# Patient Record
Sex: Female | Born: 1945
Health system: Southern US, Community
[De-identification: ages and names within clinical notes are randomized; demographics above are authoritative.]

## PROBLEM LIST (undated history)

## (undated) ENCOUNTER — Emergency Department (HOSPITAL_COMMUNITY): Admission: EM | Payer: Medicare HMO | Source: Home / Self Care

## (undated) DIAGNOSIS — N946 Dysmenorrhea, unspecified: Secondary | ICD-10-CM

## (undated) DIAGNOSIS — I2510 Atherosclerotic heart disease of native coronary artery without angina pectoris: Secondary | ICD-10-CM

## (undated) DIAGNOSIS — E2839 Other primary ovarian failure: Secondary | ICD-10-CM

## (undated) DIAGNOSIS — Z720 Tobacco use: Secondary | ICD-10-CM

## (undated) DIAGNOSIS — N809 Endometriosis, unspecified: Secondary | ICD-10-CM

## (undated) DIAGNOSIS — I10 Essential (primary) hypertension: Secondary | ICD-10-CM

## (undated) DIAGNOSIS — I639 Cerebral infarction, unspecified: Secondary | ICD-10-CM

## (undated) DIAGNOSIS — I219 Acute myocardial infarction, unspecified: Secondary | ICD-10-CM

## (undated) DIAGNOSIS — R4189 Other symptoms and signs involving cognitive functions and awareness: Secondary | ICD-10-CM

## (undated) DIAGNOSIS — N63 Unspecified lump in unspecified breast: Secondary | ICD-10-CM

## (undated) HISTORY — DX: Cerebral infarction, unspecified: I63.9

## (undated) HISTORY — PX: BACK SURGERY: SHX140

## (undated) HISTORY — DX: Tobacco use: Z72.0

## (undated) HISTORY — DX: Other primary ovarian failure: E28.39

## (undated) HISTORY — DX: Acute myocardial infarction, unspecified: I21.9

## (undated) HISTORY — DX: Atherosclerotic heart disease of native coronary artery without angina pectoris: I25.10

## (undated) HISTORY — DX: Other symptoms and signs involving cognitive functions and awareness: R41.89

## (undated) HISTORY — DX: Endometriosis, unspecified: N80.9

## (undated) HISTORY — DX: Unspecified lump in unspecified breast: N63.0

## (undated) HISTORY — PX: ABDOMINAL HYSTERECTOMY: SHX81

## (undated) HISTORY — DX: Dysmenorrhea, unspecified: N94.6

---

## 1997-11-21 ENCOUNTER — Other Ambulatory Visit: Admission: RE | Admit: 1997-11-21 | Discharge: 1997-11-21 | Payer: Self-pay | Admitting: Obstetrics and Gynecology

## 1998-01-05 ENCOUNTER — Emergency Department (HOSPITAL_COMMUNITY): Admission: EM | Admit: 1998-01-05 | Discharge: 1998-01-05 | Payer: Self-pay | Admitting: Emergency Medicine

## 1998-01-05 ENCOUNTER — Encounter: Payer: Self-pay | Admitting: Emergency Medicine

## 1998-12-08 ENCOUNTER — Other Ambulatory Visit: Admission: RE | Admit: 1998-12-08 | Discharge: 1998-12-08 | Payer: Self-pay | Admitting: Obstetrics & Gynecology

## 2000-01-26 ENCOUNTER — Other Ambulatory Visit: Admission: RE | Admit: 2000-01-26 | Discharge: 2000-01-26 | Payer: Self-pay | Admitting: Obstetrics and Gynecology

## 2001-04-19 ENCOUNTER — Other Ambulatory Visit: Admission: RE | Admit: 2001-04-19 | Discharge: 2001-04-19 | Payer: Self-pay | Admitting: Obstetrics and Gynecology

## 2002-05-20 ENCOUNTER — Encounter: Payer: Self-pay | Admitting: Neurosurgery

## 2002-05-20 ENCOUNTER — Inpatient Hospital Stay (HOSPITAL_COMMUNITY): Admission: RE | Admit: 2002-05-20 | Discharge: 2002-05-21 | Payer: Self-pay | Admitting: Neurosurgery

## 2002-06-19 ENCOUNTER — Other Ambulatory Visit: Admission: RE | Admit: 2002-06-19 | Discharge: 2002-06-19 | Payer: Self-pay | Admitting: Obstetrics and Gynecology

## 2002-07-02 ENCOUNTER — Encounter: Payer: Self-pay | Admitting: *Deleted

## 2002-07-02 ENCOUNTER — Encounter: Admission: RE | Admit: 2002-07-02 | Discharge: 2002-07-02 | Payer: Self-pay | Admitting: *Deleted

## 2005-08-05 ENCOUNTER — Inpatient Hospital Stay (HOSPITAL_COMMUNITY): Admission: AD | Admit: 2005-08-05 | Discharge: 2005-08-06 | Payer: Self-pay | Admitting: Neurosurgery

## 2006-09-05 ENCOUNTER — Encounter: Admission: RE | Admit: 2006-09-05 | Discharge: 2006-09-05 | Payer: Self-pay | Admitting: Orthopedic Surgery

## 2006-10-03 ENCOUNTER — Ambulatory Visit (HOSPITAL_COMMUNITY): Admission: RE | Admit: 2006-10-03 | Discharge: 2006-10-03 | Payer: Self-pay | Admitting: Neurosurgery

## 2006-12-28 ENCOUNTER — Encounter: Admission: RE | Admit: 2006-12-28 | Discharge: 2006-12-28 | Payer: Self-pay | Admitting: Neurosurgery

## 2007-02-05 ENCOUNTER — Inpatient Hospital Stay (HOSPITAL_COMMUNITY): Admission: RE | Admit: 2007-02-05 | Discharge: 2007-02-06 | Payer: Self-pay | Admitting: Neurosurgery

## 2007-02-11 ENCOUNTER — Emergency Department (HOSPITAL_COMMUNITY): Admission: EM | Admit: 2007-02-11 | Discharge: 2007-02-11 | Payer: Self-pay | Admitting: Emergency Medicine

## 2007-02-14 ENCOUNTER — Emergency Department (HOSPITAL_COMMUNITY): Admission: EM | Admit: 2007-02-14 | Discharge: 2007-02-14 | Payer: Self-pay | Admitting: Emergency Medicine

## 2007-02-23 ENCOUNTER — Emergency Department (HOSPITAL_COMMUNITY): Admission: EM | Admit: 2007-02-23 | Discharge: 2007-02-23 | Payer: Self-pay | Admitting: Emergency Medicine

## 2007-02-24 ENCOUNTER — Emergency Department (HOSPITAL_COMMUNITY): Admission: EM | Admit: 2007-02-24 | Discharge: 2007-02-24 | Payer: Self-pay | Admitting: Emergency Medicine

## 2007-02-27 ENCOUNTER — Emergency Department (HOSPITAL_COMMUNITY): Admission: EM | Admit: 2007-02-27 | Discharge: 2007-02-27 | Payer: Self-pay | Admitting: Emergency Medicine

## 2007-03-06 ENCOUNTER — Inpatient Hospital Stay (HOSPITAL_COMMUNITY): Admission: EM | Admit: 2007-03-06 | Discharge: 2007-03-19 | Payer: Self-pay | Admitting: Emergency Medicine

## 2007-03-06 ENCOUNTER — Encounter (INDEPENDENT_AMBULATORY_CARE_PROVIDER_SITE_OTHER): Payer: Self-pay | Admitting: Internal Medicine

## 2007-06-28 ENCOUNTER — Encounter: Admission: RE | Admit: 2007-06-28 | Discharge: 2007-06-28 | Payer: Self-pay | Admitting: Neurosurgery

## 2007-07-27 ENCOUNTER — Emergency Department (HOSPITAL_COMMUNITY): Admission: EM | Admit: 2007-07-27 | Discharge: 2007-07-27 | Payer: Self-pay | Admitting: Emergency Medicine

## 2007-09-04 ENCOUNTER — Inpatient Hospital Stay (HOSPITAL_COMMUNITY): Admission: RE | Admit: 2007-09-04 | Discharge: 2007-09-07 | Payer: Self-pay | Admitting: Neurosurgery

## 2007-09-21 ENCOUNTER — Emergency Department (HOSPITAL_COMMUNITY): Admission: EM | Admit: 2007-09-21 | Discharge: 2007-09-21 | Payer: Self-pay | Admitting: Emergency Medicine

## 2007-09-28 ENCOUNTER — Emergency Department (HOSPITAL_COMMUNITY): Admission: EM | Admit: 2007-09-28 | Discharge: 2007-09-28 | Payer: Self-pay | Admitting: Emergency Medicine

## 2007-10-02 ENCOUNTER — Encounter: Admission: RE | Admit: 2007-10-02 | Discharge: 2007-10-02 | Payer: Self-pay | Admitting: Neurosurgery

## 2008-05-26 ENCOUNTER — Emergency Department (HOSPITAL_COMMUNITY): Admission: EM | Admit: 2008-05-26 | Discharge: 2008-05-26 | Payer: Self-pay | Admitting: Emergency Medicine

## 2009-03-30 IMAGING — CR DG HIP (WITH OR WITHOUT PELVIS) 2-3V*L*
3 series · 3 of 3 positions shown · non-contrast
Comparison: None.
COMPARISON: None.

CLINICAL DATA: Fell ? left hip pain.
 LEFT HIP ? 3 VIEW:

[t pelvis a.p. *]
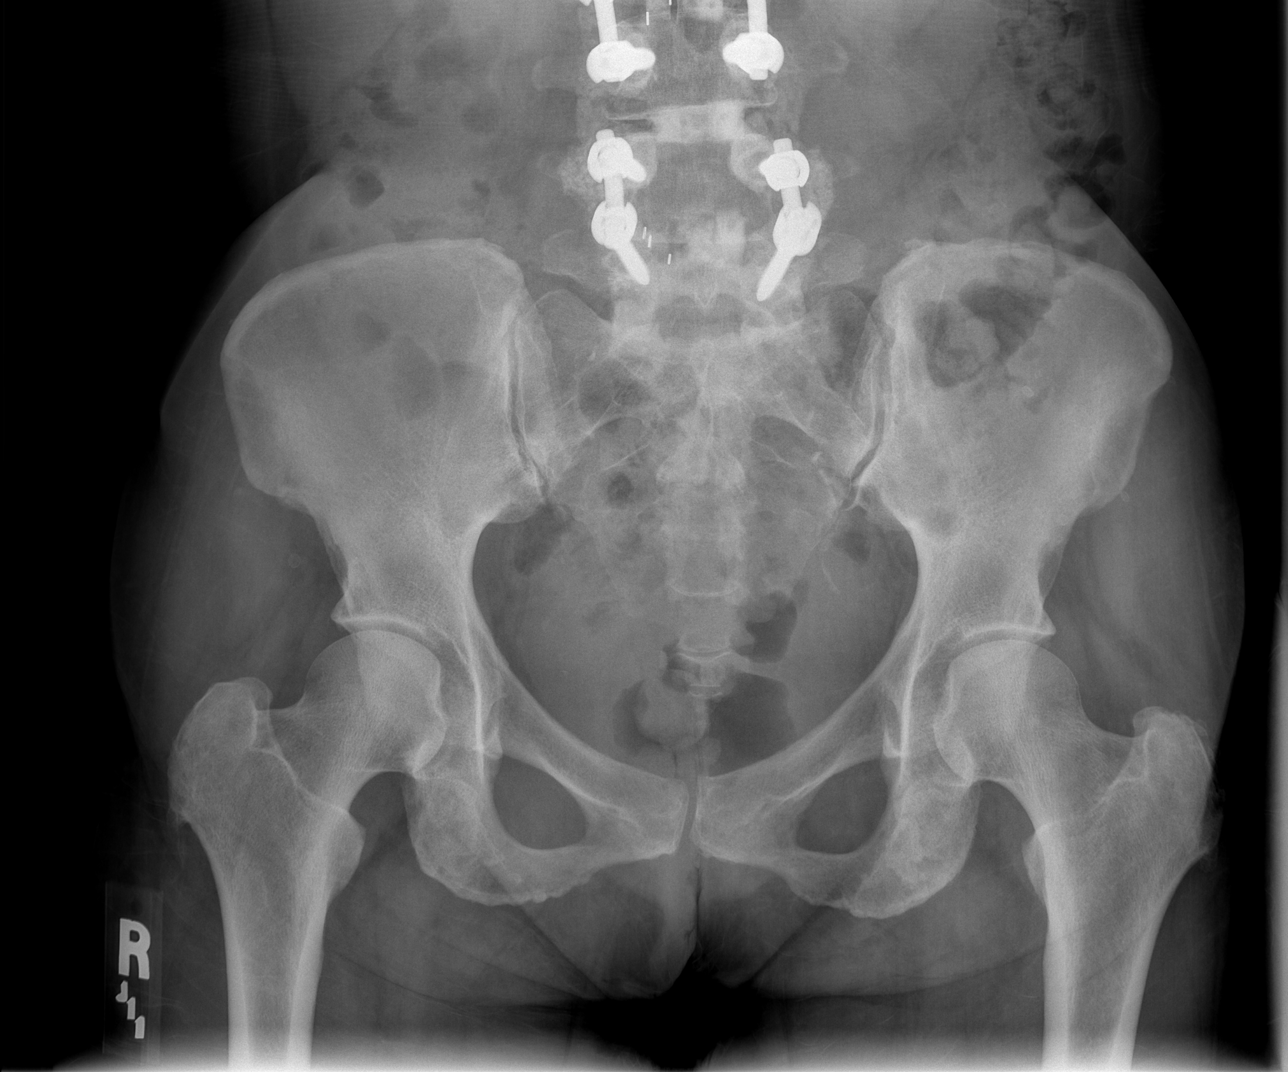

[t hip ap left]
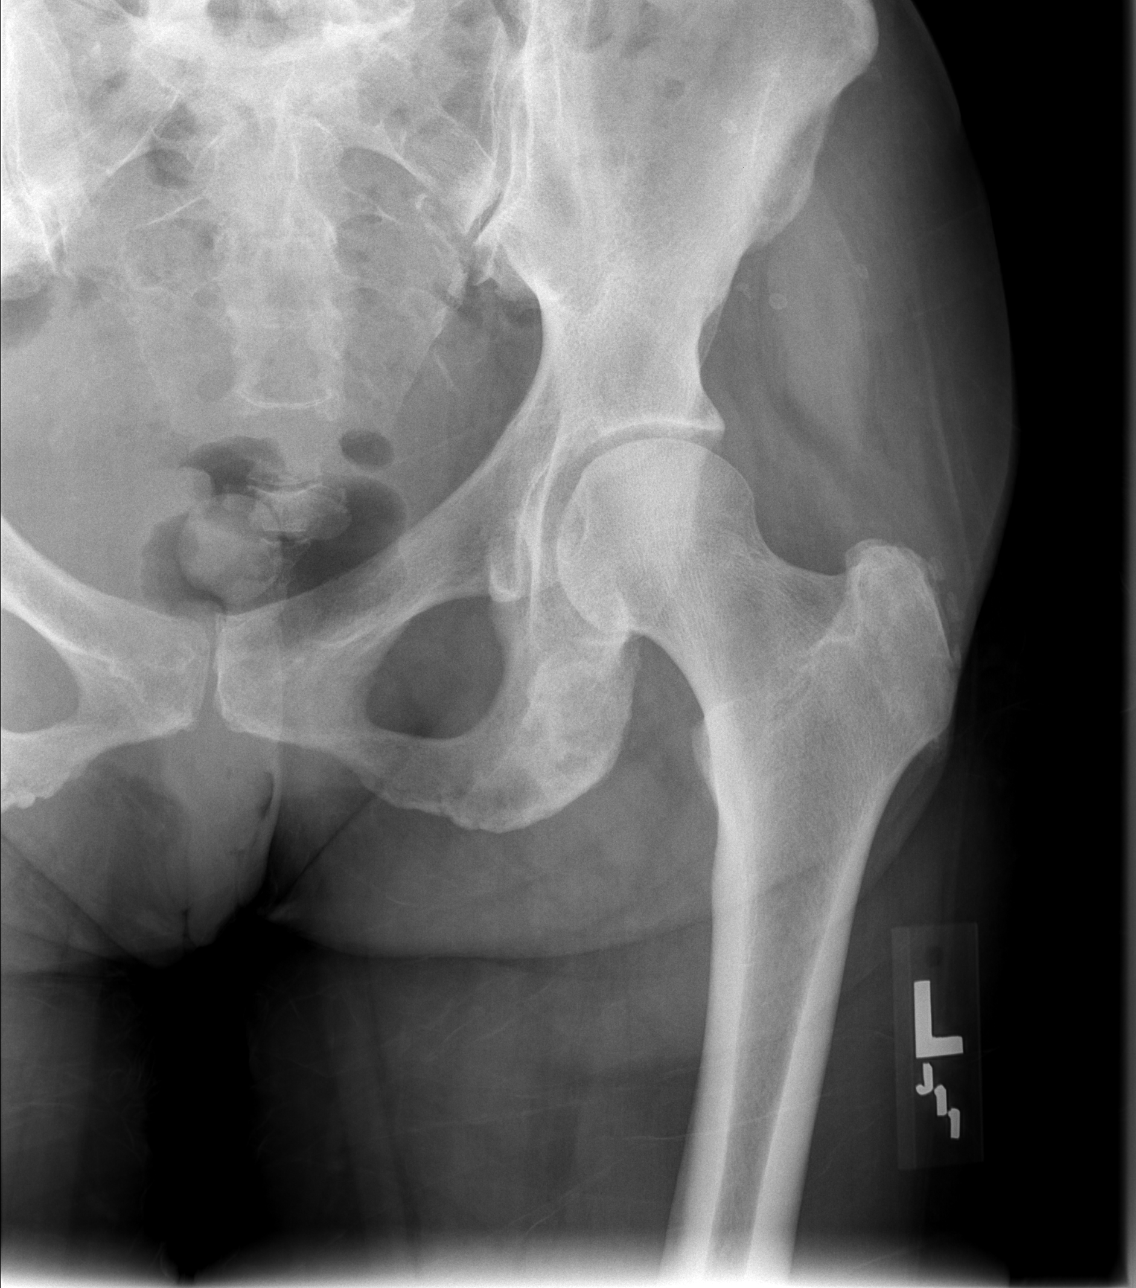

[t hip frog leg left]
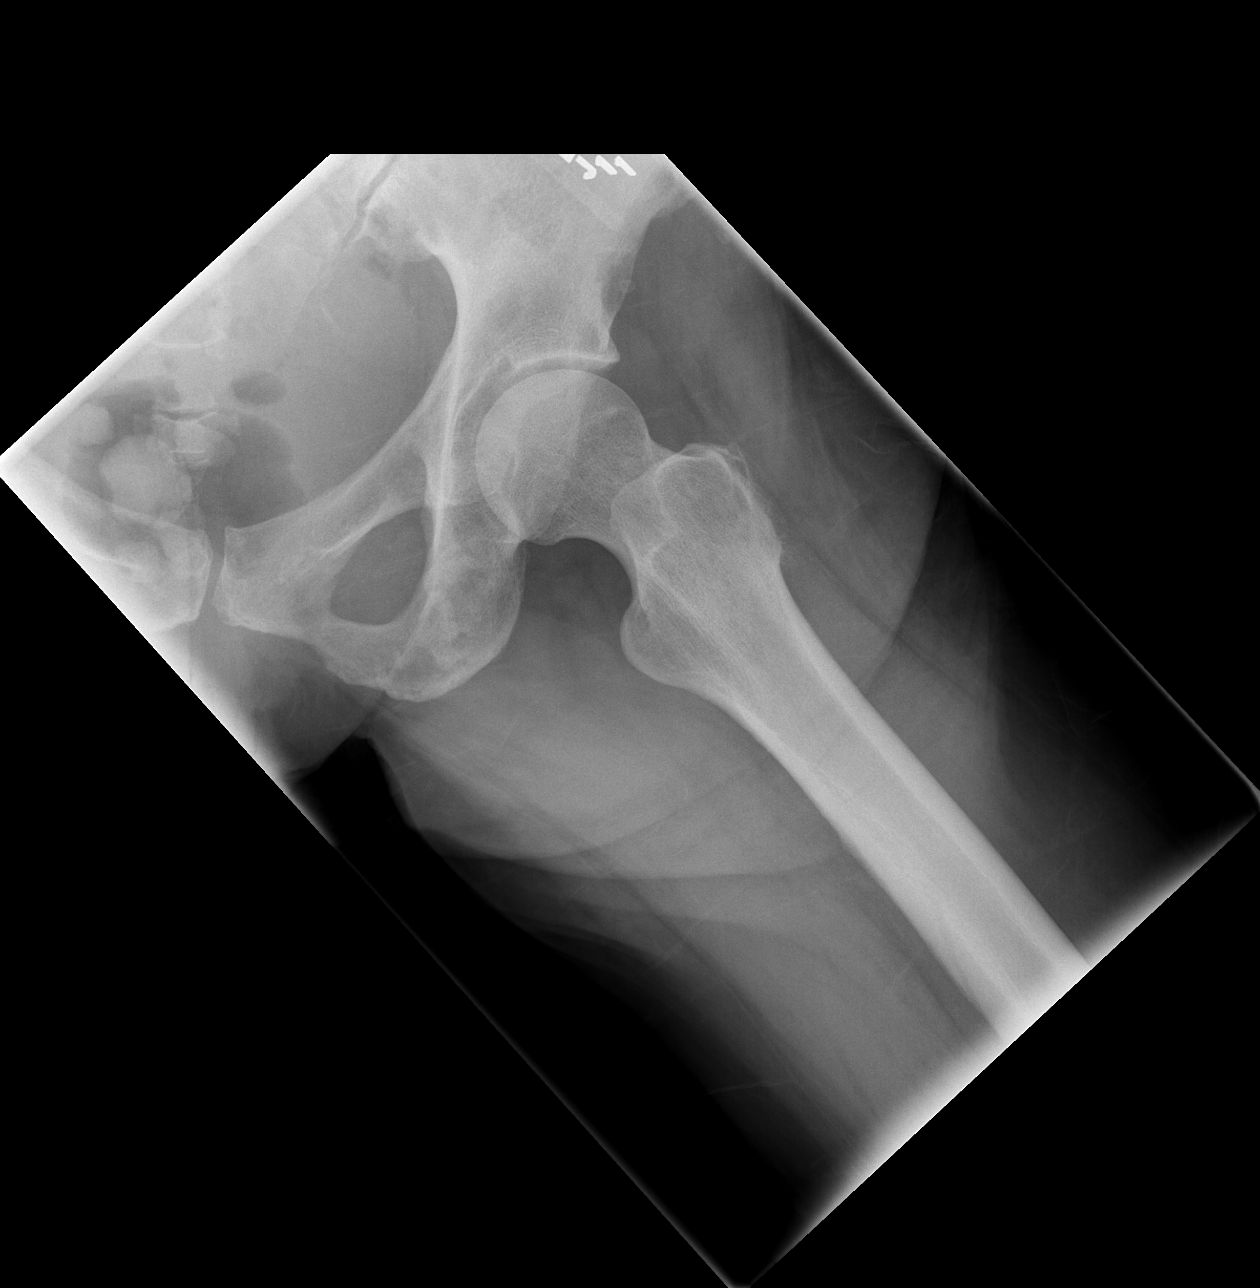

[3 of 3 positions shown; findings below may reference images not displayed]

FINDINGS: No fracture noted.  No significant arthritis. Prior lumbar fusion. Bony pelvis intact.
IMPRESSION: No acute findings.
 LEFT FEMUR ? 2 VIEW:
FINDINGS: No fracture or dislocation.  No significant degenerative changes of the knee or hip.
IMPRESSION: No acute findings.

## 2009-04-06 IMAGING — CT CT HEAD W/O CM
1 of 2 series · 14 of 30 positions shown, 18 images · IV contrast (agent unspecified)
Comparison: No comparison films available.

CLINICAL DATA: Altered level of consciousness.  Weakness.  
 HEAD CT WITHOUT CONTRAST:
TECHNIQUE: Contiguous axial images were obtained from the base of the skull through the vertex according to standard protocol without contrast.

[Series 2: brain · axial · 0.47mm/px · z∈[+152,+286]mm · 14 of 60 slices shown, 18 images]
[im 4/60  brain]
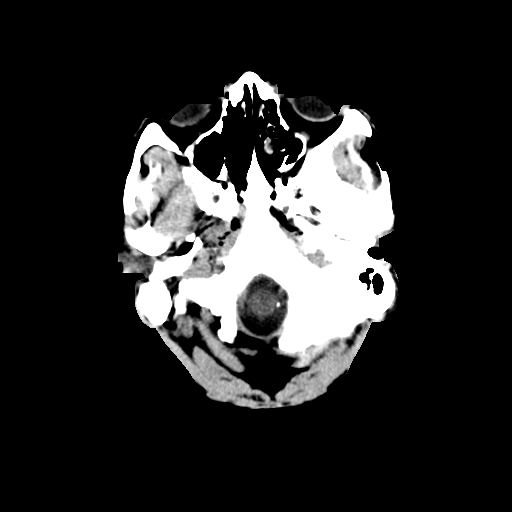
[im 4/60  bone]
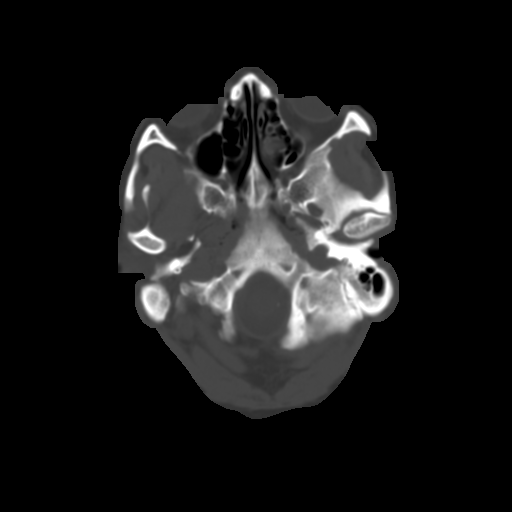
[im 8/60  brain]
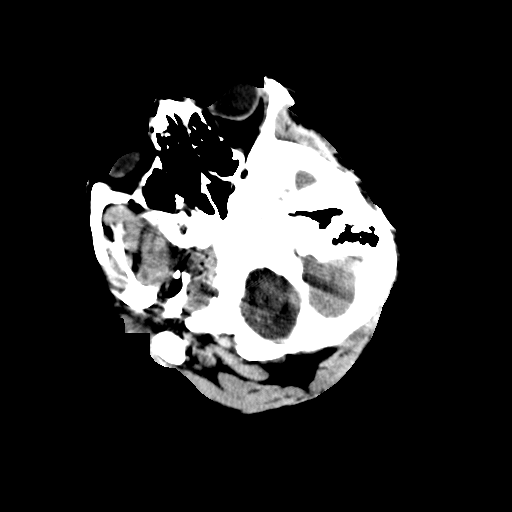
[im 12/60  brain]
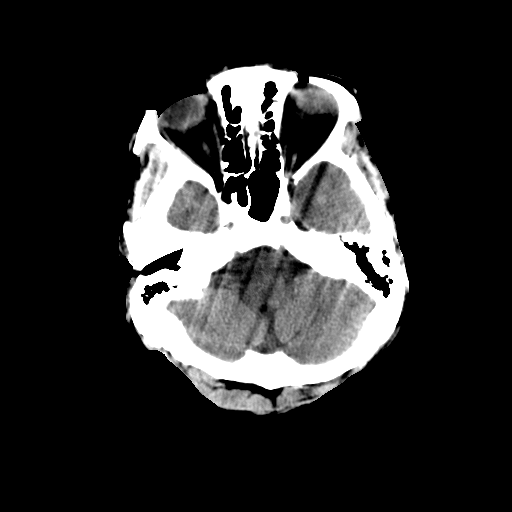
[im 16/60  brain]
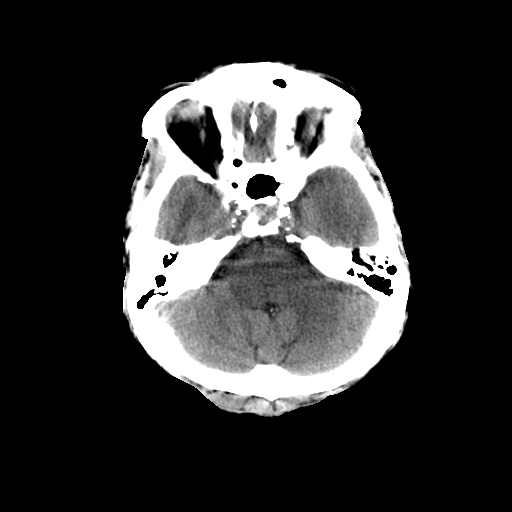
[im 20/60  brain]
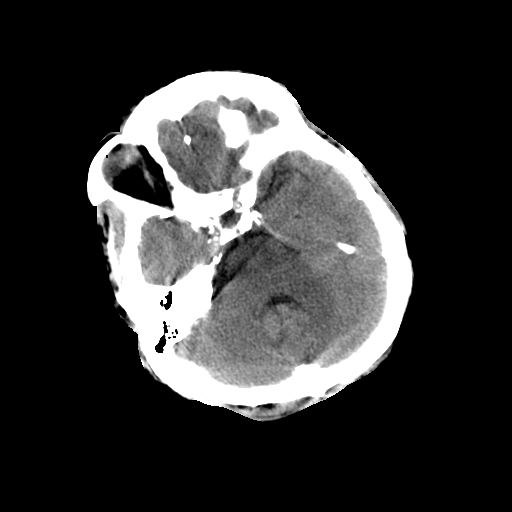
[im 20/60  bone]
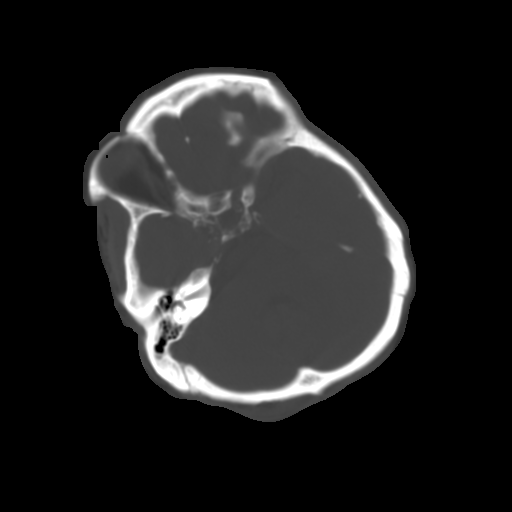
[im 24/60  brain]
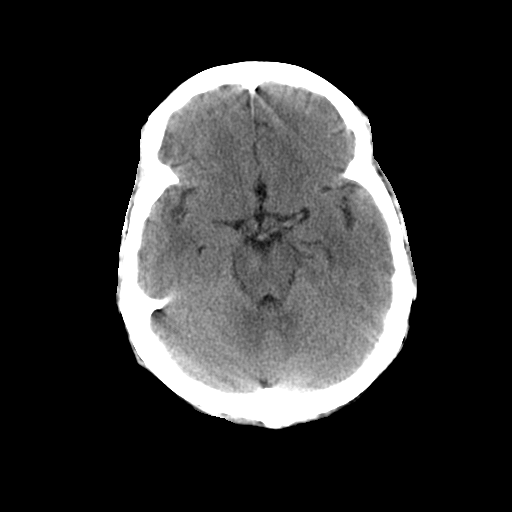
[im 28/60  brain]
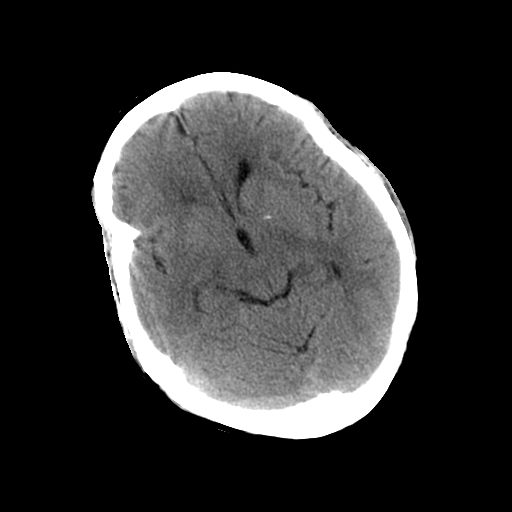
[im 32/60  brain]
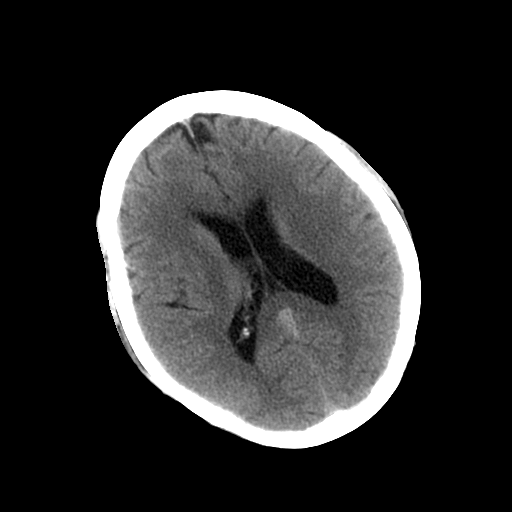
[im 36/60  brain]
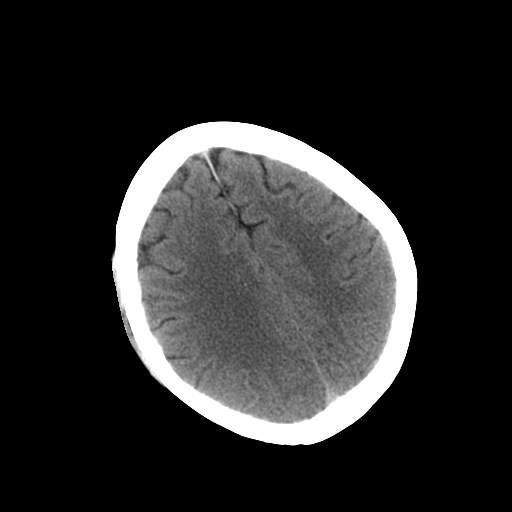
[im 36/60  bone]
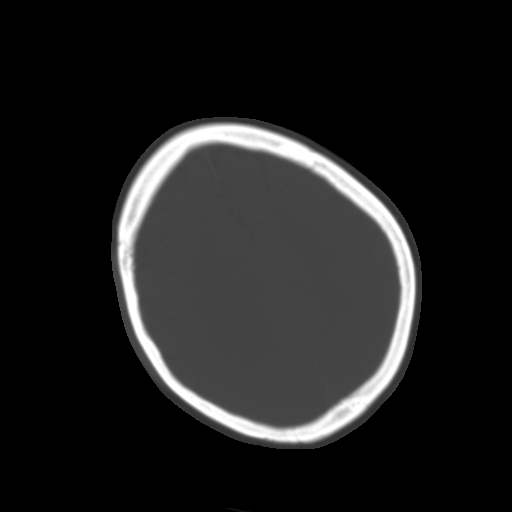
[im 40/60  brain]
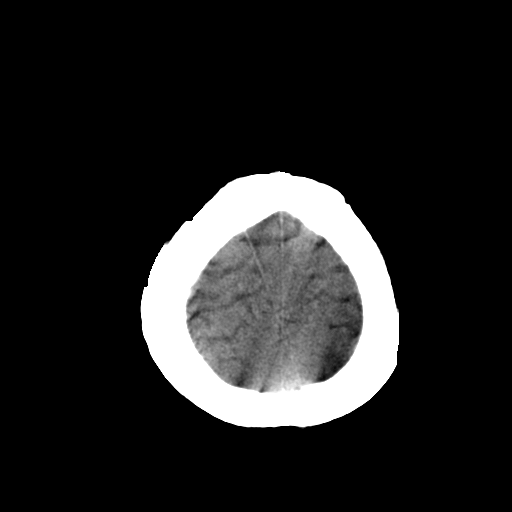
[im 44/60  brain]
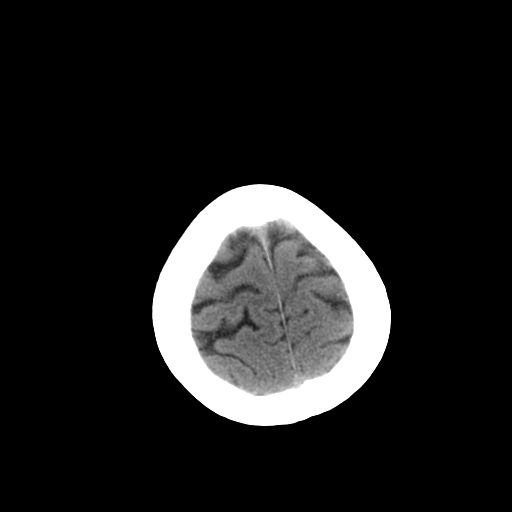
[im 48/60  brain]
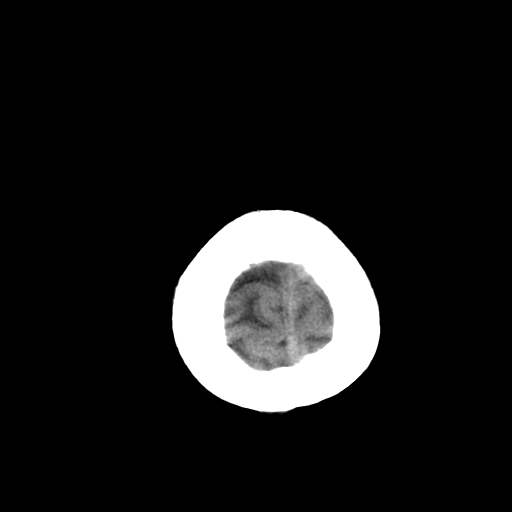
[im 52/60  brain]
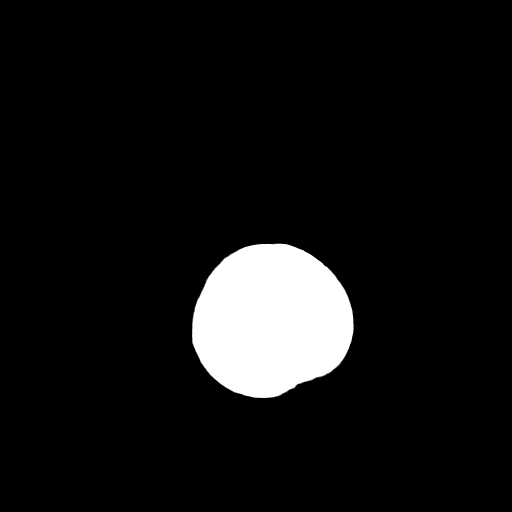
[im 52/60  bone]
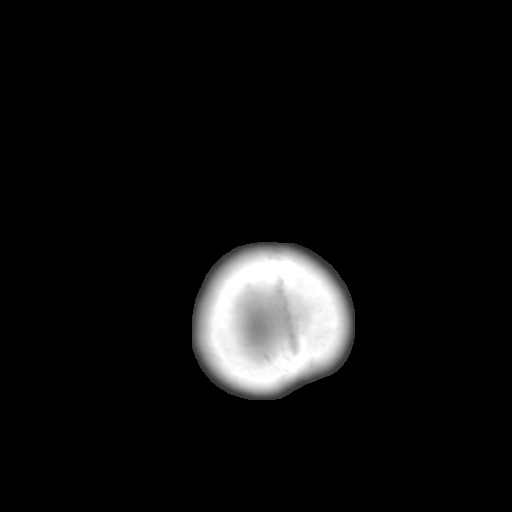
[im 56/60  brain]
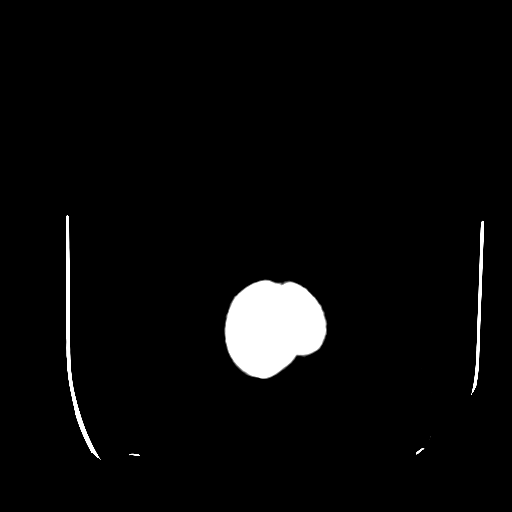

[14 of 30 positions shown; findings below may reference images not displayed]

FINDINGS: Motion artifact limits sensitivity despite numerous attempts at re-scanning.   No definite acute intracranial abnormality identified, including mass or mass effect, hydrocephalus, extra-axial fluid collection, midline shift, hemorrhage, or infarct.   Visualized bony calvarium is unremarkable.
IMPRESSION: No evidence of acute intracranial abnormality.

## 2009-04-06 IMAGING — CR DG CHEST 2V
1 series · 1 of 1 positions shown · non-contrast
Comparison: 09/27/06.

CLINICAL DATA: Chest pain. 
 CHEST - 2 VIEW - 03/06/07:

[w chest lat]
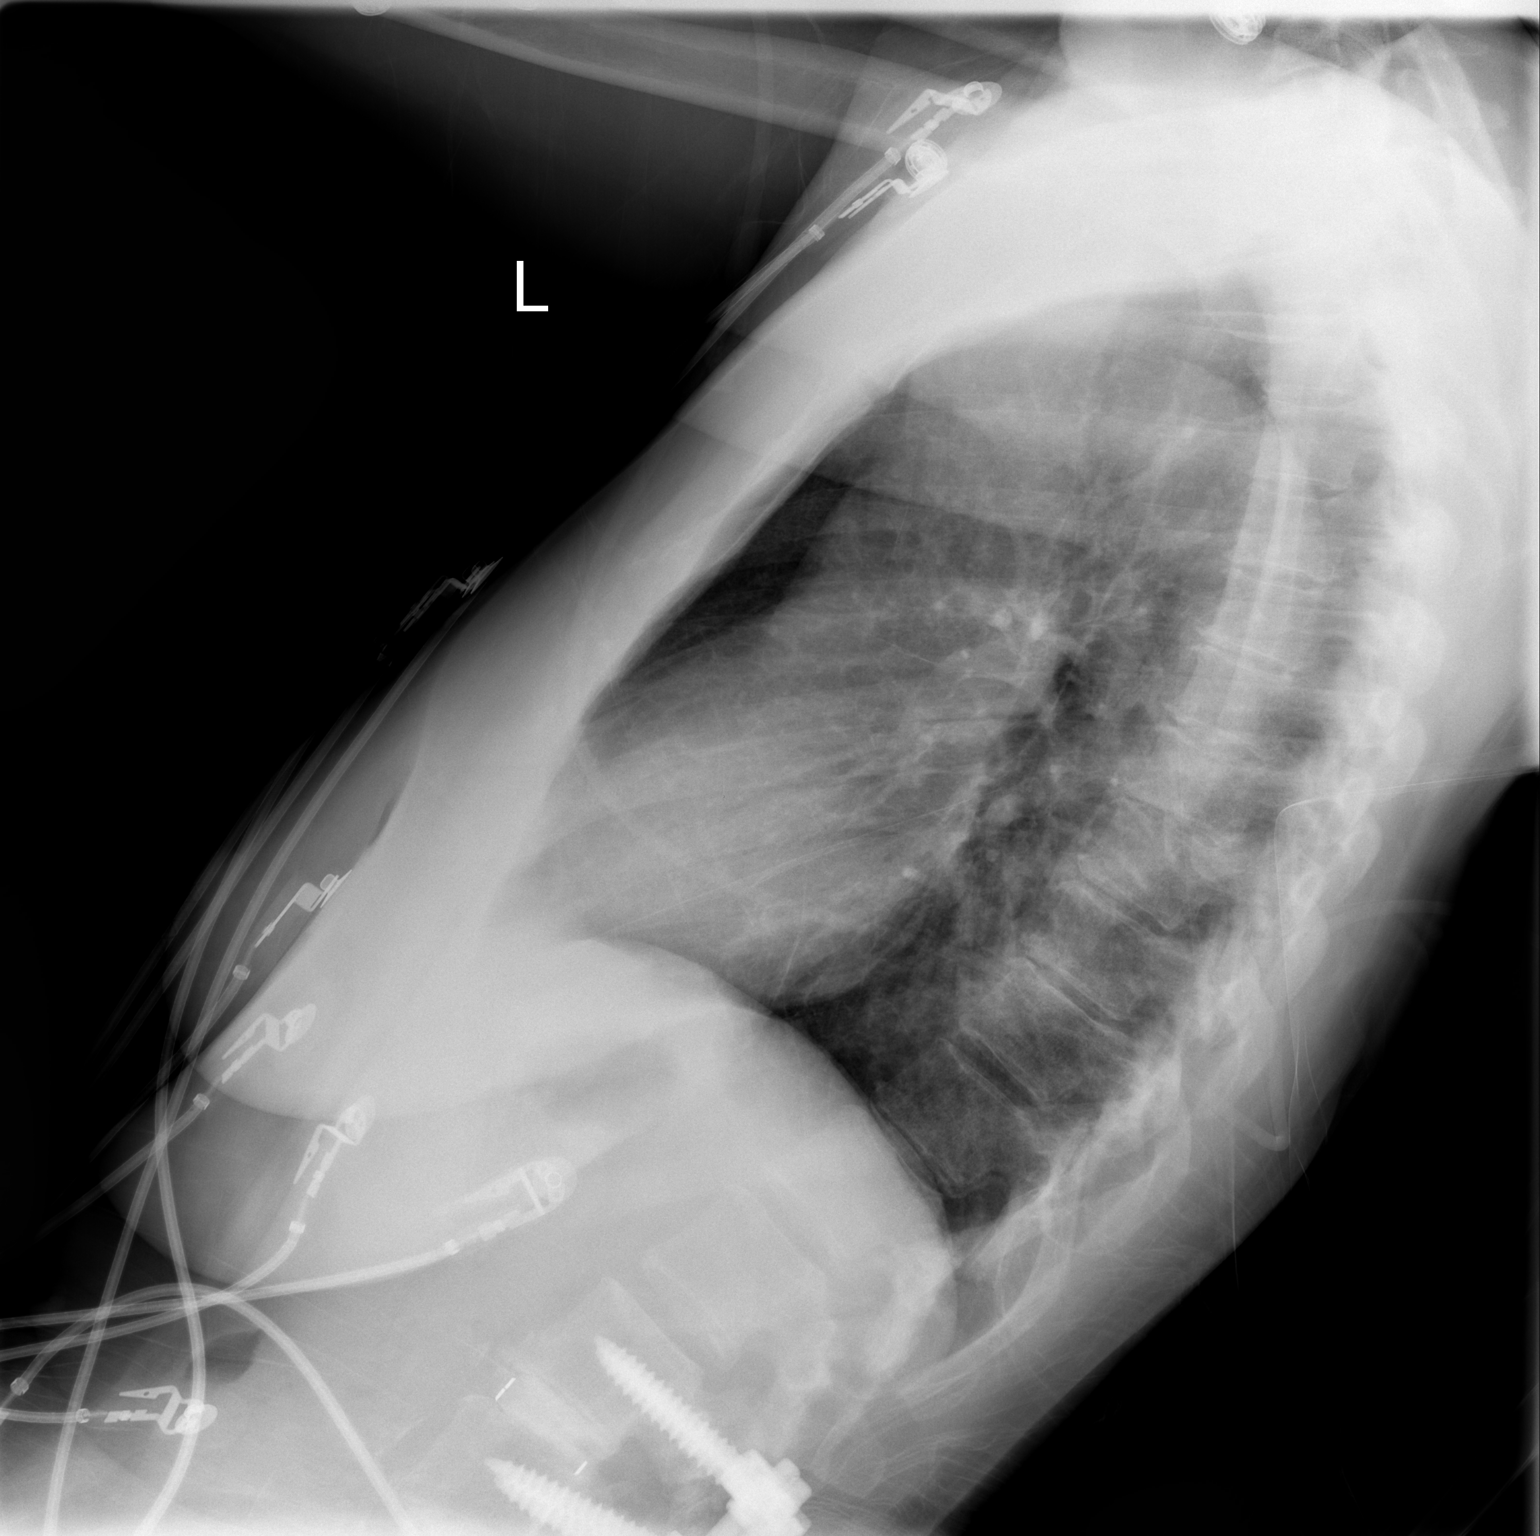

[1 of 1 positions shown; findings below may reference images not displayed]

FINDINGS: The cardiomediastinal silhouette is unremarkable. The lungs are clear.   There is no evidence of focal air space disease, pleural effusions, or pneumothorax.  Evidence of lumbar surgery is noted.
IMPRESSION: No evidence of acute cardiopulmonary disease.

## 2010-03-13 ENCOUNTER — Encounter: Payer: Self-pay | Admitting: Obstetrics and Gynecology

## 2010-03-14 ENCOUNTER — Encounter: Payer: Self-pay | Admitting: Neurosurgery

## 2010-04-08 ENCOUNTER — Encounter: Payer: Medicaid Other | Attending: Physical Medicine & Rehabilitation

## 2010-04-08 ENCOUNTER — Ambulatory Visit (HOSPITAL_BASED_OUTPATIENT_CLINIC_OR_DEPARTMENT_OTHER): Payer: Medicaid Other | Admitting: Physical Medicine & Rehabilitation

## 2010-04-08 DIAGNOSIS — I10 Essential (primary) hypertension: Secondary | ICD-10-CM | POA: Insufficient documentation

## 2010-04-08 DIAGNOSIS — R209 Unspecified disturbances of skin sensation: Secondary | ICD-10-CM | POA: Insufficient documentation

## 2010-04-08 DIAGNOSIS — F17200 Nicotine dependence, unspecified, uncomplicated: Secondary | ICD-10-CM | POA: Insufficient documentation

## 2010-04-08 DIAGNOSIS — M961 Postlaminectomy syndrome, not elsewhere classified: Secondary | ICD-10-CM | POA: Insufficient documentation

## 2010-05-14 ENCOUNTER — Encounter: Payer: Medicaid Other | Attending: Physical Medicine & Rehabilitation

## 2010-05-14 ENCOUNTER — Ambulatory Visit (HOSPITAL_BASED_OUTPATIENT_CLINIC_OR_DEPARTMENT_OTHER): Payer: Medicaid Other | Admitting: Physical Medicine & Rehabilitation

## 2010-05-14 DIAGNOSIS — F17200 Nicotine dependence, unspecified, uncomplicated: Secondary | ICD-10-CM | POA: Insufficient documentation

## 2010-05-14 DIAGNOSIS — R209 Unspecified disturbances of skin sensation: Secondary | ICD-10-CM | POA: Insufficient documentation

## 2010-05-14 DIAGNOSIS — IMO0002 Reserved for concepts with insufficient information to code with codable children: Secondary | ICD-10-CM

## 2010-05-14 DIAGNOSIS — M961 Postlaminectomy syndrome, not elsewhere classified: Secondary | ICD-10-CM | POA: Insufficient documentation

## 2010-05-14 DIAGNOSIS — I10 Essential (primary) hypertension: Secondary | ICD-10-CM | POA: Insufficient documentation

## 2010-06-02 LAB — POCT I-STAT, CHEM 8
BUN: 18 mg/dL (ref 6–23)
Calcium, Ion: 1.06 mmol/L — ABNORMAL LOW (ref 1.12–1.32)
Chloride: 110 mEq/L (ref 96–112)
Creatinine, Ser: 1 mg/dL (ref 0.4–1.2)
HCT: 45 % (ref 36.0–46.0)
Hemoglobin: 15.3 g/dL — ABNORMAL HIGH (ref 12.0–15.0)
Potassium: 3.6 mEq/L (ref 3.5–5.1)
Sodium: 135 mEq/L (ref 135–145)
TCO2: 18 mmol/L (ref 0–100)

## 2010-06-02 LAB — URINALYSIS, ROUTINE W REFLEX MICROSCOPIC
Ketones, ur: NEGATIVE mg/dL
Protein, ur: NEGATIVE mg/dL
Specific Gravity, Urine: 1.011 (ref 1.005–1.030)
Urobilinogen, UA: 0.2 mg/dL (ref 0.0–1.0)

## 2010-06-25 ENCOUNTER — Ambulatory Visit: Payer: Medicaid Other | Admitting: Physical Medicine & Rehabilitation

## 2010-07-06 NOTE — H&P (Signed)
NAMEMALENY, [name] NO.:  [id number]   MEDICAL RECORD NO.:  [id number]          PATIENT TYPE:  INP   LOCATION:  2109                         FACILITY:  [facility]   PHYSICIAN:  Della Goo, M.D. DATE OF BIRTH:  1945/10/08   DATE OF ADMISSION:  03/06/2007  DATE OF DISCHARGE:                              HISTORY & PHYSICAL   This is an unassigned patient.   CHIEF COMPLAINT:  Confusion.   HISTORY OF PRESENT ILLNESS:  This is a 65 year old female presenting to  the emergency department secondary to confusion.  The patient's family  called EMS services secondary to patient having bizarre behavior over  the past 24 hours.  The patient reportedly had taken increased amounts  of her pain medication.  The pain medication was Percocet, which was  filled on February 28, 2007 for 90 tablets and half a bottle was already  gone on January 13.  The patient was unable to give any history and  family did not remain at the emergency department to give any history.  The history is per the EMS services account.   The patient was evaluated in the emergency department and the EDP  administered Narcan times one dose secondary to the possible Percocet  overdose..  The patient became more arousable after the Narcan was given  and began to complain of severe low back pain.  Review of the medical  records reveal that the patient underwent a decompressive laminectomy of  L4-L5 on February 05, 2007.   PAST MEDICAL HISTORY:  Lumbar disk disease with multiple back surgeries.   PAST SURGICAL HISTORY:  February 05, 2007, L4-L5 redo bilaterally.  Decompressive laminectomies and foraminotomies.  August 2008 L4-L5  laminotomy and microdiskectomy.  June 2007 L3-L4 fusion and hardware  removal, and March 2000 for L3 and L4 laminectomy and foraminotomy.  All  of these procedures were performed by Dr. Julio Sicks, Neurosurgery.   MEDICATIONS:  At this time include Percocet 10/325, 1-2 tablets p.o.  q.6  h p.r.n. pain; diazepam 5 mg, two tablets p.o. q.6 h p.r.n.   ALLERGIES:  NO KNOWN DRUG ALLERGIES.   SOCIAL HISTORY:  The patient lives with her family.  She is a smoker and  she is a nondrinker.   FAMILY HISTORY:  Unable to obtain at this time.   REVIEW OF SYSTEMS:  The patient is unable to give further symptoms.   PHYSICAL EXAMINATION:  GENERAL:  This is a 65 year old well-nourished,  well-developed female in discomfort but no acute distress currently.  VITAL SIGNS:  Temperature 99.9 rectally, blood pressure 171/100, pulse  rate 108, respirations 16, O2 saturation 100%.  HEENT: Normocephalic, atraumatic.  Pupils equally round but sluggish and  reactive to light.  Extraocular muscles are intact.  Funduscopic benign.  Oropharynx is pasty and dry mucosa.  NECK:  Supple, full range of motion.  No thyromegaly, adenopathy,  jugular venous distention.  CARDIOVASCULAR:  Tachycardiac rate and rhythm.  LUNGS:  Clear to auscultation bilaterally.  ABDOMEN: Positive bowel sounds, soft, nontender, nondistended.  EXTREMITIES: Without cyanosis, clubbing or edema.  BACK: Midline surgical scar  around the lower lumbar area with eschar  formation and appears to be healing.  There is no dehiscence of this  area.  The paraspinal muscles are painful to palpation around the  surgical incision.Marland Kitchen  NEUROLOGIC:  The patient is confused and obtunded.  However, she is able  to move all four extremities.   LABORATORY STUDIES:  White blood cell count 9.1, hemoglobin 12.2,  hematocrit 36.2, platelets 323, neutrophils 91%.  Sodium 148, potassium  4.1, chloride 125, bicarb 14.3, BUN 92, creatinine 1.6, glucose 150.  Urine drug screen positive for benzodiazepines and positive for opiates.  Urinalysis positive small blood.   ASSESSMENT:  A 65 year old female being admitted with:  1. Altered mental status.  2. Dehydration.  3. Lumbar disk disease.  4. Chronic low back pain.  5. Possible overdose on  narcotic medication.   PLAN:  The patient will be admitted to an area for cardiac monitoring.  IV fluids have been ordered for rehydration therapy.  Cardiac enzymes  will be ordered.  A Tylenol level will also be ordered, and the patient  will be treated with IV Mucomyst if the level returns at toxic levels..  An imaging study of the lumbar spine will be ordered to evaluate for a  possible abscess.  DVT and GI prophylaxis have also been ordered.  Dilaudid will be given p.r.n. severe pain.      Della Goo, M.D.  Electronically Signed     HJ/MEDQ  D:  03/07/2007  T:  03/07/2007  Job:  [id number]

## 2010-07-06 NOTE — Consult Note (Signed)
NAMEJOSHUA, [name] NO.:  [id number]   MEDICAL RECORD NO.:  [id number]          PATIENT TYPE:  INP   LOCATION:  6711                         FACILITY:  [facility]   PHYSICIAN:  Antonietta Breach, M.D.  DATE OF BIRTH:  28-Apr-1945   DATE OF CONSULTATION:  03/13/2007  DATE OF DISCHARGE:  03/19/2007                                 CONSULTATION   REQUESTING PHYSICIAN:  [facility].   REASON FOR CONSULTATION:  Psychosis.   HISTORY OF PRESENT ILLNESS:  [name] is a 65 year old female  admitted to the [facility] on March 06, 2007, with confusion.   The patient's family called EMS due to the patient demonstrating bizarre  behavior.   Approximately 2 days prior to admission, the patient began to display  confusion followed by a bizarre behavior.   By report, the patient was taking increased amounts of her pain  medication.  In review of the record, the patient had filled her  Percocet on February 28, 2007, with 90 tablets and half the bottle was  already consumed by the day of admission.   The patient continues with confusion after neurosurgery.  She has been  wandering into other patients rooms. She is paranoid.   PAST PSYCHIATRIC HISTORY:  None known.   FAMILY PSYCHIATRIC HISTORY:  None known.   SOCIAL HISTORY:  [name] Todd resides with her family.  She does not drink.  She does not use illegal drugs.   PAST MEDICAL HISTORY:  In review of the past medical record, the patient  has made a number of emergency room visits including 2 in December and 3  in January.  She has had severe pain in her back.  She has had multiple  back surgeries with lumbar disk disease listed.   In June of 2007, she underwent an L3-L4 fusion with hardware removal.   ALLERGIES:  No known drug allergies.   MEDICATIONS:  The MAR is reviewed.   Head CT without contrast on March 06, 2007, showed no evidence of  acute intracranial abnormality.   QTc is 411  milliseconds.   LABORATORY DATA:  Sodium 139, BUN 4, creatinine 0.92, WBC 9.3,  hemoglobin 8.6, platelet count 219, SGOT on March 10, 2007 is 17, SGPT  26, B12 normal, and ammonia slightly elevated to 39.  Folic acid normal.  Free T4 normal.  RPR nonreactive.   REVIEW OF SYSTEMS:  CONSTITUTIONAL:  Head, eyes, ears, nose, throat,  mouth neurologic, psychiatric, cardiovascular, respiratory,  gastrointestinal, genitourinary, skin, musculoskeletal, hematologic  lymphatic, endocrine, and metabolic all unremarkable.   PHYSICAL EXAMINATION:  VITAL SIGNS:  Temperature 97.8, pulse 72,  respiratory rate 18, blood pressure 147/88, and O2 saturation on room  air 99%  GENERAL APPEARANCE:  [name] Todd is an elderly female lying in a supine  position in her hospital bed with no abnormal involuntary movements.  She is very anxious and guarded.   MENTAL STATUS EXAM:  [name] Todd is alert; however, her attention span is  decreased.  She is easily distractible with decreased concentration.  On  orientation testing, she thinks  that she is at [employer].  She  thinks the month is September, and she believes that the undersigned is  her nephew.  Memory is impaired.  Fund of knowledge and intelligence are  below that of her estimated premorbid baseline.  Her affect is guarded.  Her mood is very anxious.  Speech is mildly pressured and disorganized.  Thought process is partially disorganized.  Thought content:  Paranoid.  No thoughts of harming herself or others.  No evidence of current  hallucinations.  Insight is poor.  Judgment is impaired.   ASSESSMENT:  AXIS I:  293.00 delirium, not otherwise specified.  The  patient has had a number of contributing factors including an elevation  in her pain medication dosing at home along with an elevated ammonia  that has fallen with the most recent ammonia labs.  She also has other  general medical factors.  AXIS II:  None.  AXIS III:  See past medical  history.  AXIS IV:  General medical.  AXIS V:  20.   RECOMMENDATION:  I would start Haldol 0.5 mg p.o. or slow push IV b.i.d.  I would recheck her QTc to confirm that it is less than 500 milliseconds  after starting the Haldol.  If it rises above 500 milliseconds, we will  discontinue the Haldol.   I would keep memory and orientation cues in a low stimulation hospital  room.      Antonietta Breach, M.D.  Electronically Signed     JW/MEDQ  D:  07/09/2007  T:  07/10/2007  Job:  [id number]

## 2010-07-06 NOTE — Consult Note (Signed)
[name] Todd, ADOLPH NO.:  [id number]   MEDICAL RECORD NO.:  [id number]          PATIENT TYPE:  INP   LOCATION:  6711                         FACILITY:  [facility]   PHYSICIAN:  Melvyn Novas, M.D.  DATE OF BIRTH:  [birth year]   DATE OF CONSULTATION:  03/13/2007  DATE OF DISCHARGE:                                 CONSULTATION   The patient's consult was performed on March 13, 2007 as preparation  for her discharge, which is impending.   REASON FOR CONSULTATION:  Neurology and psychiatry are called to  evaluate this patient who was admitted with an overdose of Percocet and  has subsequently appeared confused, agitated and had hallucinatory  events.  The 65 year old [place] female with a past medical  history of severe hypertension was admitted with a blood pressure  170/100.  She also had years of severe back pain and has undergone four  lumbar disk surgeries.  The patient had presented now with an overdose  and intoxication after taking Percocet and benzodiazepines.  The patient  cannot give any medical history, is very tangential appears to have a  rapidly changing affect from pleasant to defensive to agitated and her  review of systems is adequately not given by herself but is regained  from the chart.  The patient states that she has recalled that she had a  lot of left lower back pain and therefore took some medicines to relieve  her, but she does insist that she has no suicidal ideation. She also  states she is followed by a neurologist office on [place], to which she  will return. The patient is getting pain medication through the [place]  office, she states.  For regular check ups, she has no primary care  physician except her gynecologist and she states that at home she is  independent in the activities of daily living, in spite of her pain  condition and cooks for her whole family.  She denies alcohol or  nicotine use in the admission chart.  According to the patient, she has  no known drug allergies.   PAST MEDICAL HISTORY:  Hypertension and four disk surgeries, lumbar  L3-  4 and L4-5.   MEDICATIONS AT HOME:  Percocet, diazepam. The patient cannot name her  antihypertensives, if any are prescribed.   HISTORY:  The patient has a history of smoking also, she according to  her ER admission, was not actively smoking at the time that she came to  the hospital.  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  Family history is noncontributory.  Hypertension was listed by Dr.  Karilyn Cota.  The patient's lives at 65, independent and healthy.   Vital signs:  97.8 degrees Fahrenheit, pulse 72 and regular, blood  pressure is now 147/88 still a slightly elevated respiratory rate is at  18.  Lungs:  Clear to auscultation.  Cor regular rate and rhythm.  99%  oxygenation on room air.   MEDICATIONS:  In the hospital include clonidine, Lovenox, lactulose,  vancomycin and Zosyn, which could contribute to some  confusion, Tylenol,  Haldol, Dilaudid, oxycodone and Ativan.   PHYSICAL EXAMINATION:  GENERAL:  Shows the patient to be detached, aloof  with a rather flat affect and changing to a rather defensive mode.  The  patient was asked what today's date is and stated it is somewhere in  September cannot name the day of the week or the year, did know that the  inauguration of [place] Obama takes place today, but then states she  thought it was already last week.  The patient had clear speech again  tangential thoughts and output no evidence of aphagia or dysarthria.  HEENT:  Pupils were equal to light.  Extraocular movements were intact.  There is no nystagmus.  This patient has no facial asymmetry.  Tongue  and uvula are midline.  A mild tongue tremor is noted, but no  fasciculation is seen.  NECK:  Neck is supple.  NEUROLOGIC:  The patient moves both upper extremities symmetrically.  Finger-to-nose is intact.  She is able to  drink out of a water glass  without assistance, without tremor, spilling or swallowing difficulties.  The patient was also able to walk a couple of steps with me and showed  ataxia, but no focal weakness.  She says her left leg hurts and she  actually drifted towards the left when she returned to the armchair.  On  the motor examination, she has actually brisk lower extremity reflexes  that are symmetric and no Babinski response.  Deep tendon reflexes in  the upper extremity are 1+.  Sensory seems to be intact to primary  modalities also the patient reports a tingling to touch in the left leg  L4-5 distribution.  She does have weaker hip flexion in both lower  extremities than I would expect for her peer group. I do not see muscle  atrophy.  I do not see cerebellar abnormalities. She is slightly  shuffling with her gait so this can be a side effect of the Haldol.   The patient has a positive for opiates and benzodiazepines upon  admission. She was severely hypokalemic at 2.5, had a glucose level of  102 which would be normal, calcium was 8.3. Albumin was only 2.7.  Protein was 5.6.  This hyponatremia might have also contributed to her  confusion.  She was also anemic at 8.6/25.7, H&H T3 is low at 1.7.  T4  was normal at 1.1.  B12 was recently replenished and measured at 1900.  Blood cultures given because the patient was febrile the day after her  admission,  they returned negative.  An MRI brain was obtained 2 days  ago on the 18th shows no acute infarct or abnormality, but a nonspecific  white matter diseases as they are seen with hypertension, migraine and  diabetes. 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  The patient has a C3, C4 disk protrusion with spinal stenosis  resulting.   ASSESSMENT:  The patient appears to be in delirium or even psychotic.  I  am not sure if she could not be in withdrawal in spite of her benign  appearing vital signs.  Her  MRI shows no organic reason for her.  I do  not think that she has enough lesions for vascular dementia, and her  hyponatremia has been corrected meanwhile and should not contribute to a  prolonged mental status change. At this time the neurology plan would be  to tighten her blood pressure control.  I  agree with the psychiatric  evaluation by Dr. Jeanie Sewer, who has already arrived on the ward. I  would replace opioids with Toradol and possible clonidine to prevent  withdrawal. EEG should be performed.  The patient should return to her  established neurologist and pain specialist. Hypertension workup if not  already done in the outpatient setting should be performed and the  patient should be established with a primary care internist on the  outside. I do think that she will need follow-up from psychiatry as  well,      Melvyn Novas, M.D.  Electronically Signed     CD/MEDQ  D:  03/13/2007  T:  03/14/2007  Job:  [id number]   cc:   Wilson Singer, M.D.

## 2010-07-06 NOTE — Discharge Summary (Signed)
[name], [name] NO.:  [id number]   MEDICAL RECORD NO.:  [id number]          PATIENT TYPE:  INP   LOCATION:  6711                         FACILITY:  [facility]   PHYSICIAN:  Hillery Aldo, M.D.   DATE OF BIRTH:  07/22/1945   DATE OF ADMISSION:  03/06/2007  DATE OF DISCHARGE:  03/19/2007                               DISCHARGE SUMMARY   ADDENDUM:   DATE OF DISCHARGE:  March 19, 2007.   For a complete list of the discharge diagnoses, history of present  illness, and hospital course through March 13, 2007, please see the  previously dictated discharge summary done by Dr. Karilyn Cota.   ADDITIONAL DISCHARGE DIAGNOSES:  1. Normocytic anemia of chronic disease.  2. Protein depletion malnutrition.  3. Febrile illness.  4. Acute renal failure secondary to volume depletion.   DISCHARGE MEDICATIONS:  1. Catapres 0.1 mg q.8h.  2. Iron sulfate 325 mg b.i.d.  3. Haldol 0.5 mg q.a.m., 1 mg q.h.s.  4. Potassium chloride 40 mEq daily.  5. Motrin 600 mg q.6h. p.r.n. pain.   CONSULTATIONS:  1. Antonietta Breach, M.D., psychiatry.  2. Carlus Pavlov, M.D., neurology.  3. Kathaleen Maser. Pool, M.D., neurosurgery.   PROCEDURES AND DIAGNOSTIC STUDIES:  1. CT scan of the head on March 06, 2007 showed no evidence of acute      intracranial abnormality.  2. CT scan of the lumbar spine on March 06, 2007 showed intact      hardware and stable alignment status post L2 through L5 laminectomy      and posterior lumbar interbody fusion.  There were mildly displaced      fractures through the L5 pedicles bilaterally adjacent to the      pedicle screws.  Fractures extended peripherally into the      transverse processes.  There was no resultant foraminal compromise      or lucency surrounding the hardware.  Both the L4 and L5 pedicle      screws protrude laterally beyond the cortex of the vertebral      bodies.  Posterior paraspinal fluid collection unchanged from      preceding MRI  scan.  3. Chest x-ray on March 06, 2007 showed no evidence of acute      cardiopulmonary disease.  4. MRI of the lumbar spine on March 06, 2007 showed satisfactory      appearance status post L4-L5 fusion with instrumentation.  Central      protrusion at L5-S1.  Dorsal fluid collection extending upwards      towards the superior margin of the incision; while there was mild      surrounding post-contrast enhancement, there were no specific      features that would suggest postoperative infection.  5. Abdominal ultrasound on March 06, 2006 was negative.  6. Chest x-ray on March 08, 2007 showed no active cardiopulmonary      disease.  7. MRI scan of the brain on March 11, 2007 was a motion degraded      exam without evidence of acute infarct or abnormal intracranial  enhancing lesion.  Nonspecific white matter-type changes.  Small      area of encephalomalacia anteromedial and frontal lobes, may      represent result of prior infarct or trauma.  C3-4 disk protrusion      with spinal stenosis, incompletely evaluated on this exam.  Partial      opacification of the left ethmoid sinus air cells.  8. EEG on March 14, 2007 showed mild abnormalities secondary to mild      diffuse slowing.  The slowing was suggestive of toxic metabolic or      primary neuronal disorder.   DISCHARGE LABORATORY VALUES:  Sodium 137, potassium 3.5, chloride 104,  bicarb 23, BUN 5, creatinine 0.71, glucose 102.  White blood cell count  was 6.9, hemoglobin 8.1, hematocrit 24.2, platelets 221.   REMAINDER OF HOSPITAL COURSE BY PROBLEM:  Problem 1.  Altered mental  status/psychosis.  The patient's altered mental status was felt to be  due to narcotics dependency and overdose.  As the narcotics were  metabolized from her system, the patient became more lucid but still had  some instability of affect and evidence of ongoing issues with  psychosis.  She was put on Haldol, and this was titrated up to the  dose  as noted above.  A competency evaluation done by Dr. Jeanie Sewer was  completed, and it was felt that the patient did not have consent  capacity due to ongoing impairment of her judgment and disorientation.  Dr. Jeanie Sewer re-evaluated the patient on the date of discharge, and  felt that she could safely return home.   Problem 2.  Febrile illness.  The patient had a transient fever.  Septic  workup was negative.  Her temperature has been in the normal range  subsequently.   Problem 3.  Acute renal failure secondary to volume depletion, resolved  with IV fluid rehydration.   Problem 4.  Narcotics dependency with overdose.  This was unintentional,  although the patient is clearly abusing her narcotic pain medications.  She has not been on any narcotics in the hospital, and her lower back  pain was treated with IV Toradol as needed.  She can take Motrin as an  outpatient.   Problem 5.  Protein depletion malnutrition.  This is likely due to  decreased p.o. intake in the setting of opiate dependency.  She was  encouraged to eat a well-balanced diet.   Problem 6.  Normocytic anemia.  The patient did have iron studies done  which showed a normal ferritin but low total iron binding capacity,  percent saturation, and iron.  She was put on supplemental iron therapy,  but this is not likely to help given that her anemia is of chronic  disease origin.  Consideration for an outpatient GI evaluation can be  made by her primary care physician.   Problem 7.  Chronic lower back pain.  The patient's chronic lower back  pain is stemming from her history of back surgery and fractures, as  noted in the radiographic imaging data above.  Ideally, it is felt that  the patient would benefit from revision of her lumbar fusion; however,  after evaluation with neurosurgery, it was felt that due to her medical  condition, it would be best to wait at least 2-3 weeks until she is at  her baseline mental  status before considering pursuing further surgery.  She should follow up with her neurosurgeon as an outpatient for  reconsideration of this surgery.  Problem 8.  Hypertension.  The patient's blood pressure has been  reasonably well-controlled on the clonidine.   DISPOSITION:  The patient is medically stable for discharge and was  discharged home after a subsequent evaluation by Dr. Jeanie Sewer revealed  improvement in the patient's mental capacity.      Hillery Aldo, M.D.  Electronically Signed     CR/MEDQ  D:  03/19/2007  T:  03/19/2007  Job:  [id number]

## 2010-07-06 NOTE — Consult Note (Signed)
NAMEARVA, SLAUGH NO.:  [id number]   MEDICAL RECORD NO.:  [id number]          PATIENT TYPE:  INP   LOCATION:  6711                         FACILITY:  [facility]   PHYSICIAN:  Antonietta Breach, M.D.  DATE OF BIRTH:  01-04-1946   DATE OF CONSULTATION:  03/16/2007  DATE OF DISCHARGE:  03/19/2007                                 CONSULTATION   Ms. Walby is still paranoid.  She still has impairment and orientation.  She has inappropriate smiling and impaired judgment as well as poor  insight.   REVIEW OF SYSTEMS:  NEUROLOGIC:  No rigidity or other extrapyramidal  symptoms from the Haldol.   PHYSICAL EXAMINATION:  VITAL SIGNS:  Temperature 97.6, pulse 62,  respiratory rate 20, blood pressure 150/74, and O2 saturation on room  air 100%.   MENTAL STATUS EXAM:  Ms. Kreiger is showing decreased attention on  orientation testing.  She is oriented to self.  She thinks the month is  September and the year is 8.  Thought process is disorganized.  Thought content, she has paranoia.  Her judgment is impaired.  Affect is  anxious.  Mood is anxious.  Memory is impaired.  Insight is poor.  Eye  contact is intermittent.   ASSESSMENT:  293.00 delirium, not otherwise specified.  The patient has  critical impairments in reasoning as well as appreciation over general  medical problems.  She does not have the capacity for informed consent.   RECOMMENDATIONS:  We would increase her Haldol 2.5 mg q.a.m. and 1 mg at  bedtime per anti-psychosis.      Antonietta Breach, M.D.  Electronically Signed     JW/MEDQ  D:  07/08/2007  T:  07/09/2007  Job:  [id number]

## 2010-07-06 NOTE — Op Note (Signed)
[name] Todd, [name] Todd NO.:  [id number]   MEDICAL RECORD NO.:  [id number]          PATIENT TYPE:  INP   LOCATION:  3008                         FACILITY:  [facility]   PHYSICIAN:  Kathaleen Maser. Pool, M.D.    DATE OF BIRTH:  03-15-1945   DATE OF PROCEDURE:  02/05/2007  DATE OF DISCHARGE:                               OPERATIVE REPORT   PREOPERATIVE DIAGNOSIS:  Left L4-5 recurrent herniated pulposus with  radiculopathy, severe disk degeneration and resultant stenosis at L4-5,  status post L2-L4 posterior fusion.   POSTOPERATIVE DIAGNOSIS:  Left L4-5 recurrent herniated pulposus with  radiculopathy, severe disk degeneration and resultant stenosis at L4-5,  status post L2-L4 posterior fusion.   PROCEDURE NOTE:  Re-exploration of L4-5 laminotomy with redo bilateral  decompressive laminectomy and foraminotomies, more than would be  required for simple interbody fusion alone.  L4-5 posterior lumbar  interbody fusion utilizing tangent interbody allograft wedge, Telemon  interbody PEEK cage and local autografting.  Posterolateral arthrodesis  utilizing nonsegmental pedicle screw fixation from L4-L5.  Local  autografting from L4-L5 posterolaterally.   SURGEON:  Kathaleen Maser. Pool, M.D.   ASSISTANT:  Donalee Citrin, M.D.   ANESTHESIA:  General endotracheal.   INDICATIONS FOR PROCEDURE:  [name] Todd is a 64 year old female who is  status post previous L2-3 and L3-4 decompression and fusion.  The  patient presents now with a recurrent disk herniation at L4-5 and marked  stenosis.  We discussed options for management with operative and  nonoperative care.  The patient decided proceed with L4-5 decompression  and fusion with instrumentation.   OPERATIVE NOTE:  The patient was taken to the operating room, placed on  table in supine position. After adequate anesthesia was achieved the  patient positioned prone onto Wilson frame appropriately padded.  The  patient's lumbar regions prepped and  draped sterilely.  10 blade used  make linear skin incision overlying the L4-5 interspace.  This carried  down sharply midline.  Subperiosteal dissection performed expose lamina  facet joints of L4 and L5 as well as transverse processes of L4 and L5.  Deep self-retaining retractor placed.  Intraoperative fluoroscopy used.  Levels were confirmed.  Previous laminotomy site on the left at L4-5 was  dissected free using dental instruments. Complete laminectomy of L4 was  then performed using Leksell rongeurs, Kerrison rongeurs, high-speed  drill to remove the entire lamina of L4, inferior facet of L4 was  removed bilaterally.  Superior facetectomy of L5 was performed  bilaterally.  Superior laminectomy L5 was performed bilaterally.  All  bone was cleaned and used in later autograft.  Ligament flavum and  epidural scar was elevated and resected piecemeal fashion using Kerrison  rongeurs.  Underlying thecal sac nd exiting L4-L5 nerve roots  identified.  Wide decompressive foraminotomies were performed along  course exiting L4 and L5 nerve roots bilaterally.  Starting first the  patient's left side epidural scar was dissected free from the underlying  thecal sac, L5 nerve root.  L5 nerve root and thecal sac was then gently  mobilized retracted towards midline.  Disk herniation  was encountered.  Then incised with 15 blade in rectangular fashion.  Wide disk space  clean out was achieved using pituitary rongeurs, upward pituitary  rongeurs and Epstein curettes. All elements the disk herniation  completely resected.  All loose or obvious degenerative disk material  removed from the interspace.  The procedure then repeated on the right  side without complication.  Disk space then dilated to 8 mm with the 8  mm distractor left in the patient's left side.  Thecal sac nerve roots  protected on the right side.  Disk space then reamed and cut with 8 mL  tangent instruments on the right side.  Soft tissue  removed from the  interspace.  An 8 x 22 mm Telamon cage packed with morselized autograft  was then impacted into place recessed approximately 2 mL from the  posterior cortical margin.  Distractors removed the patient's left side.  Thecal sac nerve root protected on the left side.  Disk space then  reamed and cut with 8 mm tangent instruments.  An 8 x 26 mm tangent  wedge was then packed into place and recessed approximately 1 mm from  posterior cortical margin of L4.  Pedicles L4 and L5 were then  identified using superficial landmarks intraoperative fluoroscopy.  Superficial bone around the pedicle was then removed using high-speed  drill.  Each pedicle hole then probed using pedicle awl.  Each pedicle  awl track was then probed and found to be solid in bone.  Each pedicle  awl track was then tapped with a 5.25 mm screw tapper.  Each screw hole  was probed and found to be solid within bone.  5.75 x 40 mm radius  screws placed bilaterally at L4 and L5.  Transverse processes L4 and L5  were then decorticated using high-speed drill.  Morselized autograft was  packed posterolaterally for later fusion.  Short segment titanium rods  were placed over the screw heads at L4 and L5.  Locking caps then placed  over screw heads.  Locking caps were then engaged construct under  compression.  Final images revealed good position bone grafts hardware  proper operative level normal alignment spine.  Wound was then irrigated  antibiotic solution.  Gelfoam was placed topically for hemostasis, found  to be good.  Microscope retractor system were removed.  Hemostasis  muscle achieved with electrocautery.  A medium Hemovac drains left  epidural space.  Wound was then closed in layers Vicryl suture.  Steri-  Strips sterile dressing were applied.  There were no complications.  The  patient tolerated procedure well and she returns to recovery for  postoperative care.            ______________________________  Kathaleen Maser. Pool, M.D.     HAP/MEDQ  D:  02/05/2007  T:  02/06/2007  Job:  [id number]

## 2010-07-06 NOTE — Discharge Summary (Signed)
[name] Todd, SCHWARZKOPF                ACCOUNT NO.:  [id number]   MEDICAL RECORD NO.:  [id number]          PATIENT TYPE:  INP   LOCATION:  6711                         FACILITY:  [facility]   PHYSICIAN:  Wilson Singer, M.D.DATE OF BIRTH:  04-07-1945   DATE OF ADMISSION:  03/06/2007  DATE OF DISCHARGE:                               DISCHARGE SUMMARY   CURRENT DIAGNOSES:  1. Altered mental state, probable toxic metabolic encephalopathy.  2. Dehydration and hyponatremia, resolved.  3. Lumbar disk disease.  4. Chronic low back pain.  5. Accidental narcotic overdose.   DISCHARGE MEDICATIONS:  To be determined at the time of discharge.   HISTORY:  This 65 year old lady presented to the emergency room with  confusion.  The patient had reportedly been taking increasing amounts of  pain medication following surgery that she had recently on February 05, 2007 with a laminectomy at L4-L5 with Dr. Jordan Likes, neurosurgeon.  Please  see initial history and physical examination done by Dr. Della Goo.   HOSPITAL PROGRESS:  On admission, the patient was clearly confused.  There were no focal neurological signs.  It was interesting that her  sodium was 148 with a BUN of 92 and a creatinine of 1.6.  She was felt  to be clinically very dehydrated.  CT scan of the brain was negative.  She was admitted and administered intravenous fluids.  Over the next day  or 2, she seemed to improve.  She was put on empiric intravenous  antibiotics with Zosyn and vancomycin, but blood and urine cultures were  negative.  She was reviewed by Dr. Jordan Likes who felt that  there was no  infection from the recent surgery on February 05, 2007.  After a 7 day  course of antibiotics, the antibiotics were discontinued.  She has  remained afebrile.  The only abnormality one has seen is suppressed TSH  and a T3, but she does not look clinically thyrotoxic.  She has had  episodes of confusion still, although more lucid moments.   However  today, she appears to be combative with staff and walking into another  patient's room.  She appears to be very paranoid.   PHYSICAL EXAMINATION:  VITAL SIGNS:  Temperature 97.8, blood pressure  147/88, pulse 72, saturation 99% on room air.  HEART:  Sounds are present and normal.  LUNGS:  Fields are clear.  NEUROLOGIC:  She is alert, but partially oriented.  There are no obvious  focal neurologic signs.  Certainly she is not meningitic.  She does seem  paranoid and appears to be calm when she talks to me.   LABORATORY DATA:  Sodium 139, potassium 2.5, bicarbonate 23, BUN 4,  creatinine 0.92.   PLAN:  We will attempt to replete the potassium by mouth as she has  pulled out all her IVs today.  I am asking neurology and the  psychiatrist to see her to see what their opinion is.  I feel that she  appears to have acute psychotic episode at the present time.  We will  recheck her TSH, but I do  not believe this is clinically a thyrotoxic  crisis.      Wilson Singer, M.D.  Electronically Signed     NCG/MEDQ  D:  03/13/2007  T:  03/13/2007  Job:  [id number]

## 2010-07-06 NOTE — Op Note (Signed)
NAMEFLORIENE, [name] Todd NO.:  [id number]   MEDICAL RECORD NO.:  [id number]          PATIENT TYPE:  INP   LOCATION:  3041                         FACILITY:  [facility]   PHYSICIAN:  Kathaleen Maser. Pool, M.D.    DATE OF BIRTH:  1945/09/03   DATE OF PROCEDURE:  09/04/2007  DATE OF DISCHARGE:                               OPERATIVE REPORT   PREOPERATIVE DIAGNOSIS:  L5 traumatic spondylolysis with L5-S1 grade 2  spondylolisthesis.  Status post L2 through L5 fusion.   POSTOPERATIVE DIAGNOSIS:  L5 traumatic spondylolysis with L5-S1 grade 2  spondylolisthesis.  Status post L2 through L5 fusion.   PROCEDURE:  L5 decompressive laminectomy with foraminotomies/Gill  procedure.  (More than it would not be required for simple interbody  fusion alone).  L5-S1 posterior lumbar fusion utilizing tangent  interbody allograft wedge, Telamon interbody PEEK cage and local  autografting.  Removal of L4-L5 instrumentation.  Posterolateral  arthrodesis from L4-S1 utilizing segmental pedicle screw fixation and  local autografting.   SURGEON:  Kathaleen Maser. Pool, MD   ASSISTANT:  Reinaldo Meeker, MD   ANESTHESIA:  General   INDICATIONS:  Ms. Flitton, 65 year old, female who has a long complicated  lumbar history initially starting with an L3-L4 decompression and  fusion.  Subsequently undergoing an L2-L3 decompression and fusion and  then eventually undergoing L4-L5 decompression and fusion.  The patient  presents now with traumatic spondylolysis at L5 with the resultant grade  2 unstable spondylolisthesis.  The patient presents now for  decompression and fusion.  This will require re-exploring her fusion at  L4-L5 and removing the hardware.   OPERATIVE NOTE:  The patient was brought to the operating room and  placed on the operating table in supine position.  After an adequate  level of anesthesia achieved, the patient was prone onto a Wilson frame,  firmly padded, the patient's lumbar regions  prepped and draped.  A 10-  blade was used to make a curvilinear skin incision overlying the L4-L5  and S1 levels.  This was carried out sharply in the midline.  Subperiosteal dissection performed exposing the lamina of L5 and the  sacrum as well as the pedicle screw fixation at L4-L5 and transverse  processes L4-L5 and sacral ala bilaterally.  Deep self-retainer was  placed.  Intraoperative fluoroscopy was used and levels were confirmed.  Pedicle screw fixation of  L4-L5 was disassembled.  Fusion at L4-L5 was  inspected and found to be grossly solid, but given its somewhat young  age, it was not determined to be solid or worthy of stand-alone fusion  at L5-S1.  Attention was then placed at the L5-S1 level.  The lamina of  L5 was dissected free.  Complete laminectomy of L5 and inferior  facetectomies of L5 performed bilaterally.  Rudimentary aspects of the  facet of L5 were removed bilaterally completing a Gill procedure and  widely performing a decompressive foraminotomy along the L5 nerve roots  bilaterally.  Decompressive foraminotomies then performed along the S1  nerve roots bilaterally.  Epidural venous plexus coagulated and cut.  Starting first at  patient's right side, thecal sac and nerve roots  gently mobilized towards midline.  Disk space then incised #15 blade in  a rectangular fashion.  Wide disk space was achieved using pituitary  rongeurs, upward angled rongeurs, and Epstein curettes.  The procedure  repeated on the contralateral side.  Disk space was then sequentially  dilated to 10 mm.  A 10 mm distractor left in the patient's right side.  Then attention was placed on left side.  Thecal sac nerve was checked on  the left side.  Disk space then reamed and then cut with 10 mm tangent  instrument.  Soft tissues removed interspace.  A 10 x 22 mL Telamon cage  packed with morselized autograft and Pro[employer]ix putty was then packed into  place and recessed roughly 2 mm in the  posterior cortical margin of L5.  The distractor was removed from L5-S1 on the right side.  Disk space  once again reamed and then cut with 10-mm tangent instruments.  Soft  tissue was then removed via interspace.  Disk space was packed with  morselized autograft.  A 10 x 22 mm tangent wedge was then impacted into  place, plus roughly 2 mm posterior margin of L5.  Pedicles of S1 were  identified using surface landmarks and with intraoperative fluoroscopy.  Superficial bone overlying the pedicle was then removed using high-speed  drill.  Each pedicle was then probed using pedicle awl.  Each pedicle  awl track was then tapped with 5.25-mm screw tapper.  Each screw tap  hole was probed and found to be solid bone.  A 6.75 mm x 35 mm radius  screw was placed bilaterally at S1.  Transverse processes L5, L4, and  the sacral ala were then decorticated utilizing high-speed drill.  Morselized autograft was packed posterolaterally for later fusion.  Short-segment titanium rod was then placed through the screw head and  attached using locking caps.  Locking caps then engaged and constructed  under compression.  Final images revealed good position of bone grafts  and hardware at proper operative level with normal alignment of spine.  Transverse connector was placed.  Gelfoam was placed topically for  hemostasis found to be good.  A medium Hemovac drain was left at upper  spacer.  Wound was then closed in layers with Vicryl suture.  Steri-  Strips and sterile dressings were applied.  Final images revealed good  position of bone grafts and hardware at proper operative level with  normal alignment of spine.  There is no evidence of any complication.  The patient tolerated the procedure well and she returned to the  postoperative care.           ______________________________  Kathaleen Maser. Pool, M.D.     HAP/MEDQ  D:  09/04/2007  T:  09/05/2007  Job:  [id number]

## 2010-07-06 NOTE — Procedures (Signed)
EEG NUMBER:  [id number]   HISTORY:  This is a 65 year old with bizarre behavior with overdose on  medications with altered mental status who is having EEG done to  evaluate for seizure activity.   PROCEDURE:  This is a routine EEG.   TECHNICAL DESCRIPTION:  Throughout this routine EEG, the patient starts  off being asleep during the recording.  Once the patient wakes, he seems  to have a posterior derm rhythm of 8-9 Hz activity at 10-20 microvolts.  The background activity is symmetric, mostly comprised of alpha and  theta range activity at 10-30 microvolts.  With photic stimulation,  there is mild symmetric photic driving response noted.  Hyperventilation  was not performed throughout this record.  Throughout this tracing,  there is no evidence of electrographic seizures or interictal discharge  activity.  The EKG tracing shows a heart rate of 60-70 beats per minute.   IMPRESSION:  This routine EEG is mildly abnormal secondary to mild  diffuse slowing.  The slowing is suggestive of a toxic metabolic or  primary neuronal disorder.  Clinical correlation is advised.      Bevelyn Buckles. Nash Shearer, M.D.  Electronically Signed     GEX:BMWU  D:  03/14/2007 11:42:55  T:  03/14/2007 13:22:30  Job #:  [id number]

## 2010-07-06 NOTE — Consult Note (Signed)
NAMEYESENA, REAVES NO.:  [id number]   MEDICAL RECORD NO.:  [id number]          PATIENT TYPE:  INP   LOCATION:  6711                         FACILITY:  [facility]   PHYSICIAN:  Antonietta Breach, M.D.  DATE OF BIRTH:  1945-10-12   DATE OF CONSULTATION:  03/19/2007  DATE OF DISCHARGE:  03/19/2007                                 CONSULTATION   Mrs. Sterba has regained her orientation and correct memory function.  She is socially appropriate and cooperative.  She is no longer having  hallucinations or delusions.  Her mood is within normal limits.  She is  not having any thoughts of harming herself or others.   REVIEW OF SYSTEMS:  NEUROLOGIC:  No stiffness or other neurologic side  effects of Haldol.   WBC 6.7, hemoglobin 8.4, platelet count 260.   EXAMINATION:  VITAL SIGNS:  Temperature 98.2, pulse 75, respiratory rate  20, blood pressure 136/78.   MENTAL STATUS EXAM:  Mrs. Kopecky is alert.  She is oriented to all  spheres.  Her affect is broad and appropriate.  Her attention span is  within normal limits.  Her memory is intact to immediate, recent and  remote.  She gives the correct year, month, day of the month, day of the  week, place on orientation testing.  Thought content; no thoughts of  harming herself, no thoughts of harming others, no delusions, no  hallucinations.  Thought process is logical, coherent, goal-directed.  No looseness of associations.  Judgment is intact.  Insight is good.  Affect is slightly flat at baseline but with a broad and appropriate  range.   ASSESSMENT:  AXIS I:  293.00.  Delirium not otherwise specified now  resolved.   RECOMMENDATIONS:  Would have this patient seen at one of the psychiatric  clinics attached to [facility], [facility] or [facility].   Regarding disposition the patient has regained her capacity for informed  consent and although it has been recommended for her to be admitted to a  skilled nursing  facility upon discharge she has declined.   She does have the ability to make a consistent choice.  She can  differentiate between her options and their associated risks versus  benefits.  She can appreciate the risks of her general medical problems  as they directly apply to her.  She can also reason well.  She does  explain that even though she would have reduced risk of morbidity and  mortality at a nursing home she chooses to go back home for the mental  benefit.  She does agree to call emergency services immediately for any  general medical emergencies as well as thoughts of harming herself,  thoughts of harming others or distress.   Would continue the current Haldol dosage.  The Haldol could be tried off  over 1 month once the patient is under outpatient psychiatric  management.      Antonietta Breach, M.D.  Electronically Signed     JW/MEDQ  D:  03/19/2007  T:  03/20/2007  Job:  [id number]

## 2010-07-09 NOTE — Op Note (Signed)
[name], Todd NO.:  [id number]   MEDICAL RECORD NO.:  [id number]          PATIENT TYPE:  INP   LOCATION:  2899                         FACILITY:  [facility]   PHYSICIAN:  Kathaleen Maser. Pool, M.D.    DATE OF BIRTH:  [birth year]   DATE OF PROCEDURE:  08/05/2005  DATE OF DISCHARGE:                                 OPERATIVE REPORT   SERVICE:  Neurosurgery.   PREOPERATIVE DIAGNOSES:  1.  L2-3 degenerative disk disease with stenosis and instability.  2.  Status post L3-4 decompression, fusion and instrumentation.   POSTOPERATIVE DIAGNOSES:  1.  L2-3 degenerative disk disease with stenosis and instability.  2.  Status post L3-4 decompression, fusion and instrumentation.   PROCEDURE:  1.  Reexploration of L3-4 fusion with removal of hardware.  2.  L2-3 decompressive laminectomy and foraminotomies, more than would be      required for simple interbody fusion alone.  3.  L2-3 posterior lumbar interbody fusion utilizing TANGENT allograft bone      wedge, Telamon interbody cage and local autografting.  4.  L2-3 posterolateral arthrodesis utilizing nonsegmental pedicle screw      instrumentation and local autografting.   SURGEON:  Kathaleen Maser. Pool, M.D.   ASSISTANT:  Tia Alert, M.D.   ANESTHESIA:  General oro-endotracheal.   INDICATION:  [name] Todd is a 65 year old female who is status post previous  L3-4 decompression and fusion, who presents now with worsening back and  bilateral lower extremity pain.  Workup demonstrates evidence of breakdown  and instability at the L2-3 level with resultant severe stenosis.  The  patient presents now for decompression and fusion at L2-3.  This will  require reexploring her fusion at L3-4 and removing hardware.   OPERATIVE NOTE:  The patient was brought to the operating room and placed in  the supine position.  After an adequate level of anesthesia was achieved,  the patient was positioned prone onto a Wilson frame and  appropriately  padded.  The patient's lumbar region was prepped and draped sterilely.  A 10  blade was used to make a linear skin incision overlying the L2, 3 and 4  levels.  This was carried down sharply in the midline.  Subperiosteal  dissection was performed, exposing the lamina and facet joints of L1, L2, L3  and L4.  Previous pedicle screw instrumentation at L3-4 was dissected free.  A deep self-retaining retractor was placed.  The pedicle screw  instrumentation was disconnected.  The screws at L4 were removed.  The  fusion at L3-4 was inspected and found to be quite solid.  Dissection then  proceeded cephalad.  The transverse processes of L2 were dissected free.  The retractor was repositioned.  A decompressive laminectomy was then  performed at L2-3 using Leksell rongeurs, Kerrison rongeurs and a high-speed  drill to remove the entire lamina of L2, the entire inferior facet of L2  bilaterally and the superior facet of L3 bilaterally.  The ligamentum flavum  and epidural scar was then elevated and resected in piecemeal fashion using  Kerrison rongeurs.  The  underlying thecal sac and exiting L2 and L3 nerve  roots were identified and widely decompressed with decompressive  foraminotomy.  All bone was cleaned and used for later autograft.  Epidural  venous plexus was coagulated and cut.  Turning first to the patient's right  side, the thecal sac and nerve roots were protected.  The disk space was  then incised with a 15 blade in a similar fashion and a wide disk space  cleanout was then achieved using pituitary rongeurs, upward-angled pituitary  rongeurs and Epstein curettes.  The procedure was then repeated on the  contralateral side.  The disk space was then sequentially dilated up to 8 mm  with an 8-mm distractor left on the patient's left side.  The thecal sac and  nerve roots were protected on the right side.  The disk space was then  reamed and then cut with 8-mm TANGENT  instruments.  Soft tissue was removed  from the interspace.  An 8 x 26-mm Telamon cage packed with morcelized with  autograft was then packed into place and recessed approximately 2 mm from  the posterior cortical margin of L2.  Distractor was removed from the  patient's left side.  The thecal sac and nerve roots were protected on the  left side.  The disk space was once again reamed and then cut with 8-mm  TANGENT instruments.  Soft tissue was removed from the interspace.  The disk  space was further curettaged.  Soft tissue was removed from the interspace.  Morcelized autograft was then packed into the interspace.  An 8 x 26-mm  Telamon allograft wedge was then impacted into place and recessed  approximately 2 mm from the posterior cortical margin of L2.  The pedicles  of L2 were then identified using superficial landmarks and intraoperative  fluoroscopy.  Superficial bone around the pedicle was then removed using a  high-speed drill.  Each pedicle was then probed using a pedicle awl.  Each  pedicle awl tract was then probed and found to be solidly within bone.  Each  pedicle awl tract was then tapped with a 5.25-mm screw tap.  Each screw tap  hole was probed and found to be solidly within bone.  The 6.75 x 40-mm  spiral 98D screws were placed bilaterally at L2.  The transverse processes  of L2 and L3 were then decorticated using a high-speed drill.  Morcelized  autograft was packed posterolaterally for later fusion.  A short-segment  titanium rod was then placed through the screw heads at L2 and L3.  Locking  caps were then placed over the screw heads.  The locking caps were then  engaged over the construct during compression.  Final images revealed good  position of bone graft and hardware at the proper operative level and normal  alignment of the spine.  Wound was the inspected for hemostasis, which was  found to be good.  Retractors were removed.  Hemostasis was ensured with bipolar  electrocautery.  Gelfoam was placed topically for hemostasis, which  was found to be good.  A medium Hemovac drain was left in the epidural  space.  Wound was then closed in layers with Vicryl sutures.  Steri-Strips  and sterile dressing were applied.  There were no apparent complications.  The patient tolerated the procedure well and she returned to the recovery  room postoperatively.           ______________________________  Kathaleen Maser Pool, M.D.     HAP/MEDQ  D:  08/05/2005  T:  08/05/2005  Job:  [id number]

## 2010-07-09 NOTE — Op Note (Signed)
NAMEMARLIE, KUENNEN NO.:  [id number]   MEDICAL RECORD NO.:  [id number]          PATIENT TYPE:  OIB   LOCATION:  3172                         FACILITY:  [facility]   PHYSICIAN:  Kathaleen Maser. Pool, M.D.    DATE OF BIRTH:  1945-07-04   DATE OF PROCEDURE:  10/04/2006  DATE OF DISCHARGE:                               OPERATIVE REPORT   PREOPERATIVE DIAGNOSIS:  Left L4-5 herniated pulposus with  radiculopathy.   POSTOPERATIVE DIAGNOSIS:  Left L4-5 herniated pulposus with  radiculopathy.   PROCEDURE NOTE:  Left L4-5 laminotomy and microdiskectomy.   SURGEON:  Kathaleen Maser. Pool, M.D.   ASSISTANT:  None.   ANESTHESIA:  General   PREMEDICATION:  [name] Todd is a 65 year old female with history of back  and left lower extremity pain consistent with a left-sided L4-L5  radiculopathy.  Workup demonstrates evidence of a left-sided L4-5 disk  herniation with a superior free fragment causing marked pressure left-  sided L4 nerve root.  Situation is complicated by the fact that she has  fusion at both the L2-3 and L3-4 levels.  Attempts are being made and at  minimal operation in hopes of improving her symptoms without further  destabilizing the segment.  She is certainly aware of the risks.  This  may require later fusion of this segment.   DESCRIPTION OF PROCEDURE:  The patient was taken to the operating room  and placed on the operative table in the supine position.  After  adequate level of anesthesia was achieved, the patient placed prone on  the Wilson frame with appropriate padding placed under the patient in  all regions  and prepped and sterilely draped.  A 10 blade was used to make a  linear  skin incision overlying L4-5 interspace.  This was carried sharply in  the midline.  Subperiosteal resection was then performed exposing the  lamina and the facet joints L4 and L5 on the left side.  Deep self-  retaining retractors were placed.  Intraoperative x-rays taken and the  level was confirmed.  Laminotomy then performed using high-speed drill  and Kerrison rongeurs to remove the inferior aspect of the lamina of L4,  medial aspect of L4-5 facet joint, superior remnant of L5-1.  Ligament  flavum was elevated and resected in a piecemeal fashion using the  Kerrison rongeurs.  Underlying thecal sac and exiting L5 nerve root were  identified.  Microscope brought onto the field land microdissection of  the  left side L5 nerve root, underlying disk herniation, epidural  venous plexus coagulated and cut.  Thecal sac and nerve root were gently  mobilized and retracted towards the midline.  Disk herniation was  readily apparent.  This then incised with 15 blade, retracting the  fascia.  A wide disk space clean-out was achieved using pituitary  rongeurs, upward angled pituitary rongeurs and  Epstein curettes.  All  elements of the superior fragment were completely dissected free using  blunt nerve hook and resected using pituitary rongeurs.  At this point,  a very thorough decompression compression then achieved.  There is  no  injury to thecal sac or nerve roots.  The wound was then irrigated with  antibiotic solution.  Gelfoam was placed topically and hemostasis found  to be good.  Microscopic retraction system were removed.  Hemostasis was achieved with electrocautery.  The wound was closed in  layers with Vicryl suture.  Steri-Strips and sterile dressing were  applied.  There were no intraoperative complications.  The patient  tolerated the procedure well and she returns in stable condition to the  recovery room postoperative.           ______________________________  Kathaleen Maser. Pool, M.D.     HAP/MEDQ  D:  10/03/2006  T:  10/04/2006  Job:  [id number]

## 2010-07-09 NOTE — Discharge Summary (Signed)
NAMEMARIANITA, Todd NO.:  [id number]   MEDICAL RECORD NO.:  [id number]          PATIENT TYPE:  INP   LOCATION:  3041                         FACILITY:  [facility]   PHYSICIAN:  Kathaleen Maser. Pool, M.D.    DATE OF BIRTH:  1945/09/21   DATE OF ADMISSION:  09/04/2007  DATE OF DISCHARGE:  09/07/2007                               DISCHARGE SUMMARY   FINAL DIAGNOSIS:  L5 spondylolysis with L5-S1 spondylolisthesis.   HISTORY OF PRESENT ILLNESS:  [name] is a 65 year old female with  history of a previous L3-4 and L4-5 decompression and fusion.  The  patient presents now with a progressive L5-S1 lytic spondylolisthesis.  She has failed conservative management and presents now for lumbar  decompression and fusion.   HOSPITAL COURSE:  The patient was taken to the operating room where an  uncomplicated L4-5 decompression and fusion with instrumentation was  performed.  Postoperatively, the patient awakened with good relief of  her back and lower extremity pain.  She was gradually mobilized with the  aid of physical and occupational therapy.  Her neurological exam  remained stable.  At the time of discharge, the patient's wound is  healing well.  She had been cleared for discharge home.  She will be  discharged on oral Percocet and Valium for pain control.  She will  follow up in my office in 1 week.           ______________________________  Kathaleen Maser. Pool, M.D.     HAP/MEDQ  D:  11/08/2007  T:  11/09/2007  Job:  [id number]

## 2010-07-09 NOTE — Op Note (Signed)
[name] Todd, [name] Todd NO.:  [id number]   MEDICAL RECORD NO.:  [id number]                   PATIENT TYPE:  INP   LOCATION:  2895                                 FACILITY:  [facility]   PHYSICIAN:  Kathaleen Maser. Pool, M.D.                 DATE OF BIRTH:  09-Jun-1945   DATE OF PROCEDURE:  05/20/2002  DATE OF DISCHARGE:                                 OPERATIVE REPORT   PREOPERATIVE DIAGNOSIS:  L3-4 degenerative spondylolisthesis with stenosis  and bilateral lower extremity radiculopathy.   PREOPERATIVE DIAGNOSIS:  L3-4 degenerative spondylolisthesis with stenosis  and bilateral arm radiculopathy.   PROCEDURE:  1. L3-4 decompressive laminectomy with foraminotomies.  2. L3-4 posterior lumbar interbody fusion utilizing tangent wedges and local     Autograft.  3. L3-4 posterolateral fusion utilizing pedicle screws fixation and local     Autograft.   SURGEON:  Kathaleen Maser. Pool, M.D.   ASSISTANT:  Reinaldo Meeker, M.D.   ANESTHESIA:  General endotracheal anesthesia.   INDICATIONS FOR PROCEDURE:  The patient is a 65 year old woman with a  history of severe back and bilateral lower extremity pain, right greater  than left, failing conservative management.  MRI scanning demonstrates  evidence of a grade 1 degenerative L3-4 spondylolisthesis with severe neural  foraminal stenosis and evidence of a superior disk herniation.  The patient  has been counseled as to her options.  She has failed conservative  management and she has decided to proceed with an L3-4 decompression and  fusion with instrumentation.   OPERATIVE NOTE:  The patient was taken to the operating room and placed on  the table in the supine position.  After an adequate level of general  endotracheal anesthesia was achieved, the patient was placed prone onto the  Wilson frame and appropriately padded.  The patient's lumbar region was  prepped and draped sterilely.  A double-edged sickle knife used to  make skin  incision overlying the L3-4 interspace.  This was carried down sharply in  the midline.  Subperiosteal dissection was then performed exposing the  lamina of the facet joints of L3 and L4 as well as the transverse processes  of L3 and L4.  Deep self retaining retractors were placed.  Intraoperative  fluoroscopy was used and the level was confirmed.  A decompressive  laminectomy was then performed using a high speed drill, Kerrison rongeurs  and the Leksell rongeurs to completely remove the lamina of L3, completely  removed the inferior facets at L3 bilaterally and remove the majority of the  superior facet complex of L4 bilaterally.  The ligamentum flavum was then  elevated in a piecemeal fashion using Kerrison rongeurs to free the  underlying thecal sac.  Next, the L3 and L4 nerve roots were identified.  Wide decompressive foraminotomies were then performed along the course of  the exiting L3 and L4 nerve roots.  Epidural venous plexus was then  coagulated and cut.  The thecal sac was mobilized and then retracted towards  the midline starting first on the patient's right side. The disk space was  then incised with a 15 blade in a rectangular fashion. A wide disk space  cleaning was then performed using pituitary rongeurs and upward angle  pituitary rongeurs and Epstein curettes.  The procedure was then repeated on  the contralateral side.  The disc was then sequentially dilated up to 8 mm  and an 8 mm distractor was left in the patient's right side.  With the  thecal sac and nerve roots protected, the disk space was then cut with an 8  mm tangent box cutter and then cut with an 8 mm tangent chisel.  The soft  tissues were removed from the interspace where an 8 x 26 tangent wedge was  then impacted into place and recessed approximately 1 mm from the posterior  cortical margin.  The procedure was then repeated on the right side after  removing the distractor.  Prior to instillation  of the second tangent wedge,  morselized Autograft was packed in the interspace.  The second wedge was  then impacted into place again without complications.  The pedicle at L3 and  L4 were then isolated using surface landmarks and intraoperative  fluoroscopy.  The superficial bone overlying the pedicles was removed using  the high speed drill.  Each pedicle was then probed using a pedicle awl.  Each pedicle awl tract was then tapped with a 5.25 mm screw tap.  Each screw  tap hole was probed and found to be solid in both.  The 6.75 x 40 mm spiral  __________ screws were placed bilaterally at L3 and L4.  All four screws  were found to be well positioned both by gross inspection and fluoroscopy.  The transverse processes at L3 and L4 were then decorticated using the high  speed drill.  Morselized Autograft was packed posterolaterally for use in  later fusion.  A short segment of titanium rod was then contoured and placed  over the screw edges at L3 and L5.  Locking caps were then placed over the  screw heads.  The locking caps were then engaged in a sequential fashion  with the construct under compression.  Blunt probes passed easily out each  neural foramen.  There was no evidence of disease or any residual stenosis.  Final images revealed good position of the bone grafts and hardware, proper  levels and normal appearing spine.  The wound was then irrigated one final  time.  Gelfoam was placed topically for hemostasis which was found to be  good.  A medium Hemovac drain was left in the epidural space.  The wound was  then closed in layers with Vicryl sutures.  Steri-Strips and a sterile  dressing were applied.  There were no apparent complications.  The patient  tolerated the procedure well and she returns to the recovery room  postoperative.                                               Henry A. Pool, M.D.    HAP/MEDQ  D:  05/20/2002  T:  05/20/2002  Job:  [id number]

## 2010-11-10 LAB — POCT I-STAT 3, ART BLOOD GAS (G3+)
Operator id: 287601
Patient temperature: 98.4
TCO2: 21
pCO2 arterial: 34.2 — ABNORMAL LOW
pH, Arterial: 7.368

## 2010-11-10 LAB — POCT CARDIAC MARKERS
CKMB, poc: 23.2
Myoglobin, poc: >500
Operator id: 257131
Troponin i, poc: 0.12 — ABNORMAL HIGH

## 2010-11-10 LAB — COMPREHENSIVE METABOLIC PANEL
ALT: 50 — ABNORMAL HIGH
ALT: 67 — ABNORMAL HIGH
Albumin: 3.6
Alkaline Phosphatase: 57
Alkaline Phosphatase: 74
BUN: 53 — ABNORMAL HIGH
CO2: 16 — ABNORMAL LOW
CO2: 19
CO2: 19
Calcium: 9.9
Chloride: 126 — ABNORMAL HIGH
Creatinine, Ser: 1.34 — ABNORMAL HIGH
Creatinine, Ser: 1.38 — ABNORMAL HIGH
GFR calc non Af Amer: 34 — ABNORMAL LOW
GFR calc non Af Amer: 39 — ABNORMAL LOW
GFR calc non Af Amer: 40 — ABNORMAL LOW
Glucose, Bld: 103 — ABNORMAL HIGH
Glucose, Bld: 118 — ABNORMAL HIGH
Glucose, Bld: 149 — ABNORMAL HIGH
Potassium: 3.7
Potassium: 3.9
Sodium: 144
Sodium: 148 — ABNORMAL HIGH
Total Bilirubin: 0.8
Total Protein: 7.8
Total Protein: 8.5 — ABNORMAL HIGH

## 2010-11-10 LAB — CARDIAC PANEL(CRET KIN+CKTOT+MB+TROPI)
CK, MB: 15 — ABNORMAL HIGH
CK, MB: 5.3 — ABNORMAL HIGH
Relative Index: 2.8 — ABNORMAL HIGH
Relative Index: 3.4 — ABNORMAL HIGH
Total CK: 206 — ABNORMAL HIGH
Total CK: 271 — ABNORMAL HIGH
Troponin I: 0.23 — ABNORMAL HIGH

## 2010-11-10 LAB — I-STAT 8, (EC8 V) (CONVERTED LAB)
BUN: 92 — ABNORMAL HIGH
Bicarbonate: 14.3 — ABNORMAL LOW
Glucose, Bld: 150 — ABNORMAL HIGH
TCO2: 15
pH, Ven: 7.317 — ABNORMAL HIGH

## 2010-11-10 LAB — URINALYSIS, ROUTINE W REFLEX MICROSCOPIC
Ketones, ur: 15 — AB
Protein, ur: 100 — AB
Urobilinogen, UA: 0.2

## 2010-11-10 LAB — DIFFERENTIAL
Basophils Relative: 0
Eosinophils Absolute: 0
Monocytes Relative: 4
Neutrophils Relative %: 91 — ABNORMAL HIGH

## 2010-11-10 LAB — RPR: RPR Ser Ql: NONREACTIVE

## 2010-11-10 LAB — ETHANOL: Alcohol, Ethyl (B): <5

## 2010-11-10 LAB — URINE MICROSCOPIC-ADD ON

## 2010-11-10 LAB — URINE CULTURE
Colony Count: NO GROWTH
Culture: NO GROWTH

## 2010-11-10 LAB — CBC
HCT: 31 — ABNORMAL LOW
Hemoglobin: 10.5 — ABNORMAL LOW
Hemoglobin: 12.2
MCV: 99.7
Platelets: 270
RBC: 3.11 — ABNORMAL LOW
RBC: 3.64 — ABNORMAL LOW
WBC: 9.1
WBC: 9.1

## 2010-11-10 LAB — CULTURE, BLOOD (ROUTINE X 2): Culture: NO GROWTH

## 2010-11-10 LAB — RAPID URINE DRUG SCREEN, HOSP PERFORMED: Cocaine: NOT DETECTED

## 2010-11-10 LAB — FOLATE: Folate: 6.4

## 2010-11-10 LAB — POCT I-STAT CREATININE: Operator id: 257131

## 2010-11-10 LAB — HEPATIC FUNCTION PANEL
ALT: 52 — ABNORMAL HIGH
Albumin: 3.8
Alkaline Phosphatase: 66
Total Protein: 7.9

## 2010-11-10 LAB — OCCULT BLOOD X 1 CARD TO LAB, STOOL: Fecal Occult Bld: POSITIVE

## 2010-11-10 LAB — TSH: TSH: 0.107 — ABNORMAL LOW

## 2010-11-10 LAB — VITAMIN B12 BINDING CAPACITY, BLOOD: Vitamin B12 Bind Capacity: 1993 pg/mL — ABNORMAL HIGH (ref 650–1340)

## 2010-11-11 LAB — CROSSMATCH

## 2010-11-11 LAB — COMPREHENSIVE METABOLIC PANEL
ALT: 17
ALT: 42 — ABNORMAL HIGH
Albumin: 3.9
Albumin: 2.6 — ABNORMAL LOW
Alkaline Phosphatase: 45
Alkaline Phosphatase: 50
BUN: 15
BUN: 10
BUN: 30 — ABNORMAL HIGH
CO2: 19
Calcium: 9.7
Calcium: 8.5
Calcium: 8.6
Calcium: 9.1
Creatinine, Ser: 1.06
GFR calc Af Amer: >60
GFR calc non Af Amer: 53 — ABNORMAL LOW
Glucose, Bld: 97
Glucose, Bld: 101 — ABNORMAL HIGH
Glucose, Bld: 95
Potassium: 3.5
Sodium: 138
Sodium: 153 — ABNORMAL HIGH
Total Protein: 8.2
Total Protein: 5.4 — ABNORMAL LOW
Total Protein: 5.5 — ABNORMAL LOW

## 2010-11-11 LAB — CBC
HCT: 36.2
HCT: 24.2 — ABNORMAL LOW
HCT: 25.4 — ABNORMAL LOW
HCT: 25.7 — ABNORMAL LOW
HCT: 27.1 — ABNORMAL LOW
HCT: 27.4 — ABNORMAL LOW
HCT: 28.1 — ABNORMAL LOW
Hemoglobin: 12.6
Hemoglobin: 8.1 — ABNORMAL LOW
Hemoglobin: 8.6 — ABNORMAL LOW
Hemoglobin: 8.8 — ABNORMAL LOW
Hemoglobin: 8.8 — ABNORMAL LOW
Hemoglobin: 9.2 — ABNORMAL LOW
Hemoglobin: 9.5 — ABNORMAL LOW
MCHC: 34.7
MCHC: 33.7
MCHC: 33.9
MCHC: 34.3
MCHC: 34.7
MCV: 100.3 — ABNORMAL HIGH
MCV: 99.5
MCV: 99.6
MCV: 99.8
Platelets: 583 — ABNORMAL HIGH
Platelets: 218
Platelets: 235
Platelets: 260
RBC: 2.43 — ABNORMAL LOW
RBC: 2.5 — ABNORMAL LOW
RBC: 2.57 — ABNORMAL LOW
RBC: 2.59 — ABNORMAL LOW
RBC: 2.73 — ABNORMAL LOW
RBC: 2.83 — ABNORMAL LOW
RDW: 15.2
RDW: 14.4
RDW: 14.9
RDW: 14.9
WBC: 6.7
WBC: 7.6
WBC: 7.9
WBC: 8.1
WBC: 9.3

## 2010-11-11 LAB — BASIC METABOLIC PANEL
BUN: 26 — ABNORMAL HIGH
BUN: 4 — ABNORMAL LOW
BUN: 4 — ABNORMAL LOW
CO2: 20
CO2: 23
CO2: 24
Calcium: 8.6
Calcium: 8.8
Calcium: 8.8
Chloride: 109
Chloride: 110
Chloride: 110
Chloride: 114 — ABNORMAL HIGH
Creatinine, Ser: 0.77
Creatinine, Ser: 0.92
Creatinine, Ser: 0.93
GFR calc Af Amer: >60
GFR calc Af Amer: >60
GFR calc Af Amer: >60
GFR calc Af Amer: >60
GFR calc Af Amer: >60
GFR calc non Af Amer: 55 — ABNORMAL LOW
GFR calc non Af Amer: >60
GFR calc non Af Amer: >60
Glucose, Bld: 102 — ABNORMAL HIGH
Glucose, Bld: 102 — ABNORMAL HIGH
Potassium: 2.5 — CL
Potassium: 3 — ABNORMAL LOW
Potassium: 3.1 — ABNORMAL LOW
Potassium: 3.5
Potassium: 4.1
Sodium: 137
Sodium: 138

## 2010-11-11 LAB — AMMONIA
Ammonia: 26
Ammonia: 39 — ABNORMAL HIGH

## 2010-11-11 LAB — RAPID URINE DRUG SCREEN, HOSP PERFORMED
Benzodiazepines: POSITIVE — AB
Cocaine: NOT DETECTED
Opiates: NOT DETECTED
Tetrahydrocannabinol: NOT DETECTED

## 2010-11-11 LAB — DIFFERENTIAL
Lymphocytes Relative: 29
Lymphs Abs: 1.4
Monocytes Relative: 8
Neutro Abs: 3
Neutrophils Relative %: 62

## 2010-11-11 LAB — IRON AND TIBC
Saturation Ratios: 18 — ABNORMAL LOW
TIBC: 201 — ABNORMAL LOW
UIBC: 165

## 2010-11-11 LAB — FERRITIN: Ferritin: 270 (ref 10–291)

## 2010-11-18 LAB — CBC
HCT: 39.7
Hemoglobin: 13.7
MCV: 98.3
Platelets: 283
RBC: 4.04
WBC: 5.5

## 2010-11-18 LAB — COMPREHENSIVE METABOLIC PANEL
Albumin: 4.1
Alkaline Phosphatase: 60
BUN: 13
CO2: 26
Chloride: 105
Creatinine, Ser: 0.95
GFR calc non Af Amer: >60
Potassium: 4.6
Total Bilirubin: 0.7

## 2010-11-18 LAB — DIFFERENTIAL
Basophils Absolute: 0.1
Basophils Relative: 1
Eosinophils Relative: 5
Monocytes Absolute: 0.5
Neutro Abs: 3

## 2010-11-18 LAB — TYPE AND SCREEN: ABO/RH(D): O POS

## 2010-11-29 LAB — CBC
MCHC: 34.3
MCV: 97.4
Platelets: 359
RDW: 14.6

## 2010-11-29 LAB — BASIC METABOLIC PANEL
BUN: 11
CO2: 26
Calcium: 10
Chloride: 100
Creatinine, Ser: 0.82

## 2010-11-29 LAB — TYPE AND SCREEN: ABO/RH(D): O POS

## 2010-12-06 LAB — CBC
Hemoglobin: 13.1
MCHC: 34
Platelets: 295
RDW: 15.6 — ABNORMAL HIGH

## 2010-12-06 LAB — DIFFERENTIAL
Basophils Absolute: 0.1
Basophils Relative: 1
Monocytes Absolute: 0.6
Neutro Abs: 3.9
Neutrophils Relative %: 59

## 2010-12-06 LAB — BASIC METABOLIC PANEL
BUN: 9
CO2: 28
Calcium: 9.7
Creatinine, Ser: 0.96
Glucose, Bld: 97

## 2010-12-06 LAB — TYPE AND SCREEN

## 2012-01-30 ENCOUNTER — Ambulatory Visit (INDEPENDENT_AMBULATORY_CARE_PROVIDER_SITE_OTHER): Payer: Medicare Other | Admitting: Obstetrics and Gynecology

## 2012-01-30 ENCOUNTER — Encounter: Payer: Self-pay | Admitting: Obstetrics and Gynecology

## 2012-01-30 VITALS — BP 112/62 | Ht 62.0 in | Wt 130.0 lb

## 2012-01-30 DIAGNOSIS — N63 Unspecified lump in unspecified breast: Secondary | ICD-10-CM | POA: Insufficient documentation

## 2012-01-30 DIAGNOSIS — N946 Dysmenorrhea, unspecified: Secondary | ICD-10-CM | POA: Insufficient documentation

## 2012-01-30 DIAGNOSIS — E2839 Other primary ovarian failure: Secondary | ICD-10-CM

## 2012-01-30 DIAGNOSIS — R030 Elevated blood-pressure reading, without diagnosis of hypertension: Secondary | ICD-10-CM | POA: Insufficient documentation

## 2012-01-30 DIAGNOSIS — L253 Unspecified contact dermatitis due to other chemical products: Secondary | ICD-10-CM

## 2012-01-30 DIAGNOSIS — N809 Endometriosis, unspecified: Secondary | ICD-10-CM

## 2012-01-30 MED ORDER — OXYCODONE-ACETAMINOPHEN 5-325 MG PO TABS: 1.0000 | ORAL_TABLET | ORAL | Status: DC | PRN

## 2012-01-30 MED ORDER — ESTRADIOL 0.1 MG/24HR TD PTTW: 1.0000 | MEDICATED_PATCH | TRANSDERMAL | Status: DC

## 2012-01-30 NOTE — Progress Notes (Signed)
Last Pap: 2004 WNL: Yes Regular Periods:no Contraception: Hysterectomy  Monthly Breast exam:yes Tetanus<29yrs:no Nl.Bladder Function:yes Daily BMs:yes Healthy Diet:yes Calcium:no Mammogram:yes Date of Mammogram: 40 Exercise:yes Have often Exercise: occasional Seatbelt: yes Abuse at home: no Stressful work:no Sigmoid-colonoscopy: none Bone Density: No PCP: Dr. Shana Chute Change in PMH: None Change in [facility]: None  Subjective:    [name] Todd is a 66 y.o. female G0P0000 who presents for annual exam.  The patient c/o pain in her head. States that she burned herself with hair dye and has been having pain and feelings of swelling in her head.   The following portions of the patient's history were reviewed and updated as appropriate: allergies, current medications, past family history, past medical history, past social history, past surgical history and problem list.  Review of Systems Pertinent items are noted in HPI. Gastrointestinal:No change in bowel habits, no abdominal pain, no rectal bleeding Genitourinary:negative for dysuria, frequency, hematuria, nocturia and urinary incontinence    Objective:     BP 112/62  Ht 5\' 2"  (1.575 m)  Wt 130 lb (58.968 kg)  BMI 23.78 kg/m2  Weight:  Wt Readings from Last 1 Encounters:  01/30/12 130 lb (58.968 kg)     BMI: Body mass index is 23.78 kg/(m^2). General Appearance: Alert, appropriate appearance for age. No acute distress.  Pt has had 13 lb wt loss since last visit HEENT: Grossly normal Neck / Thyroid: Supple, no masses, nodes or enlargement Lungs: clear to auscultation bilaterally Back: No CVA tenderness Breast Exam: No masses or nodes.No dimpling, nipple retraction or discharge. Cardiovascular: Regular rate and rhythm. S1, S2, no murmur Gastrointestinal: Soft, non-tender, no masses or organomegaly Pelvic Exam: External genitalia: normal general appearance Vaginal: atrophic mucosa and vaginal vault, Well healed and  suspended Cervix: removed surgically Adnexa: removed surgically Uterus: removed surgically Rectovaginal: normal rectal, no masses Lymphatic Exam: Non-palpable nodes in neck, clavicular, axillary, or inguinal regions Skin: no rash or abnormalities Neurologic: Normal gait and speech, no tremor  Psychiatric: Alert and oriented, appropriate affect.    Urinalysis:Not done    Assessment:    Menopause Chemical dermatitis    Plan:   mammogram return annually or prn Pt wants to continue estrogen patch. Estradiol transdermal 0.1 mg weekly Requests Percocet for relief of pain from chemical dermatitis Can use OTC hydrocortisone 1% for skin lesions. F/U with dermatologist if no improvement in 72 hours    Dierdre Forth MD

## 2012-01-31 ENCOUNTER — Other Ambulatory Visit: Payer: Self-pay | Admitting: Obstetrics and Gynecology

## 2012-02-01 ENCOUNTER — Telehealth: Payer: Self-pay

## 2012-02-01 NOTE — Telephone Encounter (Signed)
PC from pt stating she didn't receive her patch.  Told pt I would call it in for her today.  Called [facility] and left on vm.  Pt told to call before going to pharmacy for p/u.  Pt agreeable. ld

## 2012-02-02 ENCOUNTER — Telehealth: Payer: Self-pay

## 2012-02-02 MED ORDER — ESTRADIOL 0.1 MG/24HR TD PTWK: 1.0000 | MEDICATED_PATCH | TRANSDERMAL | Status: DC

## 2012-02-02 NOTE — Telephone Encounter (Signed)
Tc to pt per telephone call [facility] rx req. Rx for Climara Pro 0.1mg  e-pres to pharm on file.

## 2012-03-08 ENCOUNTER — Telehealth: Payer: Self-pay | Admitting: Obstetrics and Gynecology

## 2012-03-08 NOTE — Telephone Encounter (Signed)
Lm on vm to cb per telephone call. Spoke with Dorene Grebe at [facility] on file. Pharm did not fill rx for Climara Pro 1 patch weekly that was sent on 02/01/13;mistakingly thinking it was a duplicate. Rx will be filled.

## 2012-03-08 NOTE — Telephone Encounter (Signed)
[facility] pt 

## 2012-03-09 NOTE — Telephone Encounter (Signed)
Tc to pt per 03/08/12 phone call with pharm. Pt states,"has received medication".

## 2012-05-07 ENCOUNTER — Telehealth: Payer: Self-pay | Admitting: Obstetrics and Gynecology

## 2012-05-07 NOTE — Telephone Encounter (Signed)
Tc from pt. Pt c/o burning and itching of the skin from the Estradiol patch. Pt wants to cont estradiol;however not in patch form. Will consult with [facility] per recs. Pt agrees.

## 2012-05-07 NOTE — Telephone Encounter (Signed)
D/c patch. Estradiol 1mg  po daily #30 with refills through [facility] F/u for [facility]

## 2012-05-08 MED ORDER — ESTRADIOL 1 MG PO TABS: 1.0000 mg | ORAL_TABLET | Freq: Every day | ORAL | Status: DC

## 2012-05-08 NOTE — Telephone Encounter (Signed)
Tc to pt per telephone call. Rx for Estradiol e-pres to pharm on file.

## 2012-09-24 ENCOUNTER — Encounter (HOSPITAL_COMMUNITY)
Admission: EM | Disposition: A | Discharge status: Home Health | Payer: Self-pay | Source: Home / Self Care | Attending: Surgery

## 2012-09-24 ENCOUNTER — Inpatient Hospital Stay (HOSPITAL_COMMUNITY)
Admission: EM | Admit: 2012-09-24 | Discharge: 2012-09-28 | DRG: 232 | Disposition: A | Discharge status: Home Health | Payer: Medicare Other | Attending: Surgery | Admitting: Surgery

## 2012-09-24 ENCOUNTER — Encounter (HOSPITAL_COMMUNITY): Payer: Self-pay | Admitting: Certified Registered Nurse Anesthetist

## 2012-09-24 ENCOUNTER — Inpatient Hospital Stay (HOSPITAL_COMMUNITY): Payer: Medicare Other

## 2012-09-24 ENCOUNTER — Encounter (HOSPITAL_COMMUNITY): Payer: Self-pay | Admitting: Emergency Medicine

## 2012-09-24 ENCOUNTER — Inpatient Hospital Stay (HOSPITAL_COMMUNITY): Payer: Medicare Other | Admitting: Certified Registered Nurse Anesthetist

## 2012-09-24 ENCOUNTER — Emergency Department (HOSPITAL_COMMUNITY): Payer: Medicare Other

## 2012-09-24 DIAGNOSIS — I959 Hypotension, unspecified: Secondary | ICD-10-CM | POA: Diagnosis not present

## 2012-09-24 DIAGNOSIS — F17200 Nicotine dependence, unspecified, uncomplicated: Secondary | ICD-10-CM | POA: Diagnosis present

## 2012-09-24 DIAGNOSIS — I129 Hypertensive chronic kidney disease with stage 1 through stage 4 chronic kidney disease, or unspecified chronic kidney disease: Secondary | ICD-10-CM | POA: Diagnosis present

## 2012-09-24 DIAGNOSIS — E8779 Other fluid overload: Secondary | ICD-10-CM | POA: Diagnosis not present

## 2012-09-24 DIAGNOSIS — I4891 Unspecified atrial fibrillation: Secondary | ICD-10-CM | POA: Diagnosis present

## 2012-09-24 DIAGNOSIS — R079 Chest pain, unspecified: Secondary | ICD-10-CM | POA: Insufficient documentation

## 2012-09-24 DIAGNOSIS — I4510 Unspecified right bundle-branch block: Secondary | ICD-10-CM | POA: Diagnosis present

## 2012-09-24 DIAGNOSIS — Z951 Presence of aortocoronary bypass graft: Secondary | ICD-10-CM

## 2012-09-24 DIAGNOSIS — J9819 Other pulmonary collapse: Secondary | ICD-10-CM | POA: Diagnosis not present

## 2012-09-24 DIAGNOSIS — Z720 Tobacco use: Secondary | ICD-10-CM

## 2012-09-24 DIAGNOSIS — D62 Acute posthemorrhagic anemia: Secondary | ICD-10-CM | POA: Diagnosis not present

## 2012-09-24 DIAGNOSIS — I2582 Chronic total occlusion of coronary artery: Secondary | ICD-10-CM | POA: Diagnosis present

## 2012-09-24 DIAGNOSIS — I10 Essential (primary) hypertension: Secondary | ICD-10-CM | POA: Diagnosis present

## 2012-09-24 DIAGNOSIS — I480 Paroxysmal atrial fibrillation: Secondary | ICD-10-CM | POA: Diagnosis present

## 2012-09-24 DIAGNOSIS — I2119 ST elevation (STEMI) myocardial infarction involving other coronary artery of inferior wall: Principal | ICD-10-CM | POA: Diagnosis present

## 2012-09-24 DIAGNOSIS — N183 Chronic kidney disease, stage 3 unspecified: Secondary | ICD-10-CM | POA: Diagnosis present

## 2012-09-24 DIAGNOSIS — I2510 Atherosclerotic heart disease of native coronary artery without angina pectoris: Secondary | ICD-10-CM | POA: Diagnosis present

## 2012-09-24 DIAGNOSIS — Z833 Family history of diabetes mellitus: Secondary | ICD-10-CM

## 2012-09-24 DIAGNOSIS — Z79899 Other long term (current) drug therapy: Secondary | ICD-10-CM

## 2012-09-24 DIAGNOSIS — Z7982 Long term (current) use of aspirin: Secondary | ICD-10-CM

## 2012-09-24 HISTORY — DX: Essential (primary) hypertension: I10

## 2012-09-24 HISTORY — PX: CORONARY ARTERY BYPASS GRAFT: SHX141

## 2012-09-24 HISTORY — PX: TEE WITHOUT CARDIOVERSION: SHX5443

## 2012-09-24 HISTORY — PX: ENDOVEIN HARVEST OF GREATER SAPHENOUS VEIN: SHX5059

## 2012-09-24 LAB — BASIC METABOLIC PANEL
BUN: 25 mg/dL — ABNORMAL HIGH (ref 6–23)
GFR calc non Af Amer: 42 mL/min — ABNORMAL LOW (ref >90)
Glucose, Bld: 135 mg/dL — ABNORMAL HIGH (ref 70–99)
Potassium: 3.5 mEq/L (ref 3.5–5.1)

## 2012-09-24 LAB — POCT I-STAT, CHEM 8
BUN: 15 mg/dL (ref 6–23)
Calcium, Ion: 1.16 mmol/L (ref 1.13–1.30)
Calcium, Ion: 1.11 mmol/L — ABNORMAL LOW (ref 1.13–1.30)
Chloride: 106 mEq/L (ref 96–112)
Glucose, Bld: 126 mg/dL — ABNORMAL HIGH (ref 70–99)
HCT: 33 % — ABNORMAL LOW (ref 36.0–46.0)
Hemoglobin: 11.2 g/dL — ABNORMAL LOW (ref 12.0–15.0)
TCO2: 26 mmol/L (ref 0–100)

## 2012-09-24 LAB — LIPID PANEL
HDL: 37 mg/dL — ABNORMAL LOW (ref >39)
LDL Cholesterol: 58 mg/dL (ref 0–99)
Total CHOL/HDL Ratio: 2.8 RATIO

## 2012-09-24 LAB — POCT I-STAT 3, ART BLOOD GAS (G3+)
Acid-base deficit: 2 mmol/L (ref 0.0–2.0)
Bicarbonate: 26.7 mEq/L — ABNORMAL HIGH (ref 20.0–24.0)
O2 Saturation: 95 %
Patient temperature: 34.8
pCO2 arterial: 51.7 mmHg — ABNORMAL HIGH (ref 35.0–45.0)
pH, Arterial: 7.271 — ABNORMAL LOW (ref 7.350–7.450)
pH, Arterial: 7.32 — ABNORMAL LOW (ref 7.350–7.450)
pO2, Arterial: 143 mmHg — ABNORMAL HIGH (ref 80.0–100.0)
pO2, Arterial: 63 mmHg — ABNORMAL LOW (ref 80.0–100.0)

## 2012-09-24 LAB — CBC
HCT: 33.6 % — ABNORMAL LOW (ref 36.0–46.0)
HCT: 34.3 % — ABNORMAL LOW (ref 36.0–46.0)
Hemoglobin: 11.9 g/dL — ABNORMAL LOW (ref 12.0–15.0)
Hemoglobin: 12 g/dL (ref 12.0–15.0)
MCH: 32.6 pg (ref 26.0–34.0)
MCH: 31.9 pg (ref 26.0–34.0)
MCHC: 35.4 g/dL (ref 30.0–36.0)
MCV: 92.6 fL (ref 78.0–100.0)
MCV: 90.1 fL (ref 78.0–100.0)
Platelets: 222 10*3/uL (ref 150–400)
RBC: 3.75 MIL/uL — ABNORMAL LOW (ref 3.87–5.11)
RDW: 14 % (ref 11.5–15.5)
WBC: 12.5 10*3/uL — ABNORMAL HIGH (ref 4.0–10.5)

## 2012-09-24 LAB — POCT ACTIVATED CLOTTING TIME: Activated Clotting Time: 406 seconds

## 2012-09-24 LAB — PROTIME-INR: Prothrombin Time: 12.3 seconds (ref 11.6–15.2)

## 2012-09-24 LAB — DIFFERENTIAL
Basophils Absolute: 0.1 10*3/uL (ref 0.0–0.1)
Eosinophils Absolute: 0.3 10*3/uL (ref 0.0–0.7)
Eosinophils Relative: 6 % — ABNORMAL HIGH (ref 0–5)
Lymphs Abs: 2.9 10*3/uL (ref 0.7–4.0)

## 2012-09-24 LAB — CREATININE, SERUM
Creatinine, Ser: 0.91 mg/dL (ref 0.50–1.10)
GFR calc Af Amer: 75 mL/min — ABNORMAL LOW (ref >90)
GFR calc non Af Amer: 65 mL/min — ABNORMAL LOW (ref >90)

## 2012-09-24 LAB — APTT: aPTT: 43 seconds — ABNORMAL HIGH (ref 24–37)

## 2012-09-24 LAB — MRSA PCR SCREENING: MRSA by PCR: NEGATIVE

## 2012-09-24 LAB — GLUCOSE, CAPILLARY: Glucose-Capillary: 119 mg/dL — ABNORMAL HIGH (ref 70–99)

## 2012-09-24 SURGERY — CANCELLED PROCEDURE

## 2012-09-24 SURGERY — LEFT AND RIGHT HEART CATHETERIZATION WITH CORONARY ANGIOGRAM
Anesthesia: LOCAL

## 2012-09-24 SURGERY — CORONARY ARTERY BYPASS GRAFTING (CABG)
Anesthesia: General | Site: Leg Upper | Laterality: Right | Wound class: Clean

## 2012-09-24 MED ORDER — DOPAMINE-DEXTROSE 3.2-5 MG/ML-% IV SOLN
2.0000 ug/kg/min | INTRAVENOUS | Status: DC
  Filled 2012-09-24: qty 250

## 2012-09-24 MED ORDER — LACTATED RINGERS IV SOLN
INTRAVENOUS | Status: DC | PRN
  Administered 2012-09-24: 05:00:00 via INTRAVENOUS

## 2012-09-24 MED ORDER — THROMBIN 20000 UNITS EX KIT
PACK | CUTANEOUS | Status: DC | PRN
  Administered 2012-09-24: 20000 [IU] via TOPICAL

## 2012-09-24 MED ORDER — INSULIN ASPART 100 UNIT/ML ~~LOC~~ SOLN
0.0000 [IU] | SUBCUTANEOUS | Status: DC
  Administered 2012-09-24 (×2): 2 [IU] via SUBCUTANEOUS

## 2012-09-24 MED ORDER — ONDANSETRON HCL 4 MG/2ML IJ SOLN: 4.0000 mg | Freq: Four times a day (QID) | INTRAMUSCULAR | Status: DC | PRN

## 2012-09-24 MED ORDER — CHLORHEXIDINE GLUCONATE 4 % EX LIQD
60.0000 mL | Freq: Once | CUTANEOUS | Status: DC
  Filled 2012-09-24: qty 60

## 2012-09-24 MED ORDER — HEPARIN SODIUM (PORCINE) 1000 UNIT/ML IJ SOLN
INTRAMUSCULAR | Status: DC | PRN
  Administered 2012-09-24: 16000 [IU] via INTRAVENOUS

## 2012-09-24 MED ORDER — HEMOSTATIC AGENTS (NO CHARGE) OPTIME
TOPICAL | Status: DC | PRN
  Administered 2012-09-24: 1 via TOPICAL

## 2012-09-24 MED ORDER — HEPARIN (PORCINE) IN NACL 2-0.9 UNIT/ML-% IJ SOLN
INTRAMUSCULAR | Status: AC
  Filled 2012-09-24: qty 1000

## 2012-09-24 MED ORDER — FENTANYL CITRATE 0.05 MG/ML IJ SOLN
INTRAMUSCULAR | Status: AC
Start: 2012-09-24 — End: 2012-09-24
  Filled 2012-09-24: qty 2

## 2012-09-24 MED ORDER — SODIUM CHLORIDE 0.9 % IJ SOLN
3.0000 mL | Freq: Two times a day (BID) | INTRAMUSCULAR | Status: DC
  Administered 2012-09-25 – 2012-09-28 (×6): 3 mL via INTRAVENOUS

## 2012-09-24 MED ORDER — ALBUMIN HUMAN 5 % IV SOLN
INTRAVENOUS | Status: DC | PRN
  Administered 2012-09-24: 09:00:00 via INTRAVENOUS

## 2012-09-24 MED ORDER — LACTATED RINGERS IV SOLN: 500.0000 mL | Freq: Once | INTRAVENOUS | Status: AC | PRN

## 2012-09-24 MED ORDER — ACETAMINOPHEN 160 MG/5ML PO SOLN
1000.0000 mg | Freq: Four times a day (QID) | ORAL | Status: DC
  Filled 2012-09-24: qty 40

## 2012-09-24 MED ORDER — NITROGLYCERIN IN D5W 200-5 MCG/ML-% IV SOLN: 0.0000 ug/min | INTRAVENOUS | Status: DC

## 2012-09-24 MED ORDER — DOCUSATE SODIUM 100 MG PO CAPS
200.0000 mg | ORAL_CAPSULE | Freq: Every day | ORAL | Status: DC
  Administered 2012-09-25 – 2012-09-27 (×3): 200 mg via ORAL
  Filled 2012-09-24 (×4): qty 2

## 2012-09-24 MED ORDER — METOPROLOL TARTRATE 12.5 MG HALF TABLET
12.5000 mg | ORAL_TABLET | Freq: Two times a day (BID) | ORAL | Status: DC
  Filled 2012-09-24 (×4): qty 1

## 2012-09-24 MED ORDER — PLASMA-LYTE 148 IV SOLN
INTRAVENOUS | Status: DC | PRN
  Administered 2012-09-24: 06:00:00 via INTRAVASCULAR

## 2012-09-24 MED ORDER — MIDAZOLAM HCL 5 MG/5ML IJ SOLN
INTRAMUSCULAR | Status: DC | PRN
  Administered 2012-09-24: 3 mg via INTRAVENOUS
  Administered 2012-09-24: 2 mg via INTRAVENOUS
  Administered 2012-09-24: 1 mg via INTRAVENOUS
  Administered 2012-09-24 (×2): 2 mg via INTRAVENOUS

## 2012-09-24 MED ORDER — INSULIN REGULAR BOLUS VIA INFUSION
0.0000 [IU] | Freq: Three times a day (TID) | INTRAVENOUS | Status: DC
  Filled 2012-09-24: qty 10

## 2012-09-24 MED ORDER — METOPROLOL TARTRATE 12.5 MG HALF TABLET
12.5000 mg | ORAL_TABLET | Freq: Once | ORAL | Status: DC
  Filled 2012-09-24: qty 1

## 2012-09-24 MED ORDER — SODIUM BICARBONATE 8.4 % IV SOLN
50.0000 meq | Freq: Once | INTRAVENOUS | Status: AC
  Administered 2012-09-24: 50 meq via INTRAVENOUS

## 2012-09-24 MED ORDER — FENTANYL CITRATE 0.05 MG/ML IJ SOLN
50.0000 ug | Freq: Once | INTRAMUSCULAR | Status: AC
  Administered 2012-09-24: 50 ug via INTRAVENOUS

## 2012-09-24 MED ORDER — METOCLOPRAMIDE HCL 5 MG/ML IJ SOLN
10.0000 mg | Freq: Four times a day (QID) | INTRAMUSCULAR | Status: AC
  Administered 2012-09-24 – 2012-09-25 (×4): 10 mg via INTRAVENOUS
  Filled 2012-09-24 (×4): qty 2

## 2012-09-24 MED ORDER — ASPIRIN 81 MG PO CHEW: 324.0000 mg | CHEWABLE_TABLET | Freq: Every day | ORAL | Status: DC

## 2012-09-24 MED ORDER — FAMOTIDINE IN NACL 20-0.9 MG/50ML-% IV SOLN
20.0000 mg | Freq: Two times a day (BID) | INTRAVENOUS | Status: DC
  Administered 2012-09-24: 20 mg via INTRAVENOUS

## 2012-09-24 MED ORDER — SODIUM CHLORIDE 0.45 % IV SOLN
INTRAVENOUS | Status: DC
  Administered 2012-09-24: 10:00:00 via INTRAVENOUS

## 2012-09-24 MED ORDER — PHENYLEPHRINE HCL 10 MG/ML IJ SOLN
INTRAMUSCULAR | Status: DC | PRN
  Administered 2012-09-24: 80 ug via INTRAVENOUS

## 2012-09-24 MED ORDER — SODIUM CHLORIDE 0.9 % IV SOLN
INTRAVENOUS | Status: DC
  Filled 2012-09-24: qty 30

## 2012-09-24 MED ORDER — TEMAZEPAM 7.5 MG PO CAPS: 15.0000 mg | ORAL_CAPSULE | Freq: Once | ORAL | Status: AC | PRN

## 2012-09-24 MED ORDER — 0.9 % SODIUM CHLORIDE (POUR BTL) OPTIME
TOPICAL | Status: DC | PRN
  Administered 2012-09-24: 1000 mL

## 2012-09-24 MED ORDER — SODIUM CHLORIDE 0.9 % IV SOLN
INTRAVENOUS | Status: DC
  Filled 2012-09-24: qty 1

## 2012-09-24 MED ORDER — ACETAMINOPHEN 160 MG/5ML PO SOLN
650.0000 mg | Freq: Once | ORAL | Status: AC
  Administered 2012-09-24: 650 mg
  Filled 2012-09-24: qty 20.3

## 2012-09-24 MED ORDER — BISACODYL 5 MG PO TBEC
5.0000 mg | DELAYED_RELEASE_TABLET | Freq: Once | ORAL | Status: DC
  Filled 2012-09-24 (×2): qty 2

## 2012-09-24 MED ORDER — ARTIFICIAL TEARS OP OINT
TOPICAL_OINTMENT | OPHTHALMIC | Status: DC | PRN
  Administered 2012-09-24: 1 via OPHTHALMIC

## 2012-09-24 MED ORDER — SODIUM CHLORIDE 0.9 % IJ SOLN
OROMUCOSAL | Status: DC | PRN
  Administered 2012-09-24: 06:00:00 via TOPICAL

## 2012-09-24 MED ORDER — ONDANSETRON HCL 4 MG/2ML IJ SOLN
INTRAMUSCULAR | Status: AC
  Filled 2012-09-24: qty 2

## 2012-09-24 MED ORDER — BISACODYL 5 MG PO TBEC
10.0000 mg | DELAYED_RELEASE_TABLET | Freq: Every day | ORAL | Status: DC
  Administered 2012-09-25 – 2012-09-27 (×3): 10 mg via ORAL
  Filled 2012-09-24 (×2): qty 2

## 2012-09-24 MED ORDER — PHENYLEPHRINE HCL 10 MG/ML IJ SOLN
30.0000 ug/min | INTRAVENOUS | Status: DC
  Administered 2012-09-24: 25 ug/min via INTRAVENOUS
  Filled 2012-09-24: qty 2

## 2012-09-24 MED ORDER — LIDOCAINE HCL (PF) 1 % IJ SOLN
INTRAMUSCULAR | Status: AC
  Filled 2012-09-24: qty 30

## 2012-09-24 MED ORDER — ASPIRIN EC 325 MG PO TBEC
325.0000 mg | DELAYED_RELEASE_TABLET | Freq: Every day | ORAL | Status: DC
  Administered 2012-09-25 – 2012-09-28 (×4): 325 mg via ORAL
  Filled 2012-09-24 (×4): qty 1

## 2012-09-24 MED ORDER — NITROGLYCERIN IN D5W 200-5 MCG/ML-% IV SOLN
2.0000 ug/min | INTRAVENOUS | Status: DC
  Administered 2012-09-24: 16.6 ug/min via INTRAVENOUS
  Filled 2012-09-24: qty 250

## 2012-09-24 MED ORDER — ALBUMIN HUMAN 5 % IV SOLN
250.0000 mL | INTRAVENOUS | Status: AC | PRN
  Administered 2012-09-24 (×3): 250 mL via INTRAVENOUS
  Filled 2012-09-24: qty 250

## 2012-09-24 MED ORDER — MAGNESIUM SULFATE 50 % IJ SOLN
40.0000 meq | INTRAMUSCULAR | Status: DC
  Filled 2012-09-24: qty 10

## 2012-09-24 MED ORDER — METOPROLOL TARTRATE 1 MG/ML IV SOLN: 2.5000 mg | INTRAVENOUS | Status: DC | PRN

## 2012-09-24 MED ORDER — EPHEDRINE SULFATE 50 MG/ML IJ SOLN
INTRAMUSCULAR | Status: DC | PRN
  Administered 2012-09-24: 5 mg via INTRAVENOUS

## 2012-09-24 MED ORDER — EPINEPHRINE HCL 1 MG/ML IJ SOLN
0.5000 ug/min | INTRAVENOUS | Status: DC
  Filled 2012-09-24: qty 4

## 2012-09-24 MED ORDER — DEXTROSE 5 % IV SOLN
750.0000 mg | INTRAVENOUS | Status: DC
  Filled 2012-09-24: qty 750

## 2012-09-24 MED ORDER — DEXTROSE 5 % IV SOLN
1.5000 g | Freq: Two times a day (BID) | INTRAVENOUS | Status: AC
  Administered 2012-09-24 – 2012-09-26 (×4): 1.5 g via INTRAVENOUS
  Filled 2012-09-24 (×6): qty 1.5

## 2012-09-24 MED ORDER — DEXTROSE 5 % IV SOLN
1.5000 g | INTRAVENOUS | Status: DC
  Administered 2012-09-24: 1.5 g via INTRAVENOUS
  Administered 2012-09-24: .75 g via INTRAVENOUS
  Filled 2012-09-24: qty 1.5

## 2012-09-24 MED ORDER — PHENYLEPHRINE HCL 10 MG/ML IJ SOLN
0.0000 ug/min | INTRAVENOUS | Status: DC
  Administered 2012-09-24: 50 ug/min via INTRAVENOUS
  Administered 2012-09-25: 15 ug/min via INTRAVENOUS
  Administered 2012-09-25: 45 ug/min via INTRAVENOUS
  Filled 2012-09-24 (×3): qty 2

## 2012-09-24 MED ORDER — PANTOPRAZOLE SODIUM 40 MG PO TBEC
40.0000 mg | DELAYED_RELEASE_TABLET | Freq: Every day | ORAL | Status: DC
  Administered 2012-09-26 – 2012-09-28 (×3): 40 mg via ORAL
  Filled 2012-09-24 (×3): qty 1

## 2012-09-24 MED ORDER — METOPROLOL TARTRATE 1 MG/ML IV SOLN
INTRAVENOUS | Status: AC
  Filled 2012-09-24: qty 5

## 2012-09-24 MED ORDER — SODIUM CHLORIDE 0.9 % IJ SOLN: 3.0000 mL | INTRAMUSCULAR | Status: DC | PRN

## 2012-09-24 MED ORDER — ATORVASTATIN CALCIUM 80 MG PO TABS
80.0000 mg | ORAL_TABLET | Freq: Every day | ORAL | Status: DC
  Administered 2012-09-24 – 2012-09-27 (×3): 80 mg via ORAL
  Filled 2012-09-24 (×6): qty 1

## 2012-09-24 MED ORDER — SUCCINYLCHOLINE CHLORIDE 20 MG/ML IJ SOLN
INTRAMUSCULAR | Status: DC | PRN
  Administered 2012-09-24: 100 mg via INTRAVENOUS

## 2012-09-24 MED ORDER — ROCURONIUM BROMIDE 100 MG/10ML IV SOLN
INTRAVENOUS | Status: DC | PRN
  Administered 2012-09-24: 20 mg via INTRAVENOUS
  Administered 2012-09-24: 50 mg via INTRAVENOUS
  Administered 2012-09-24: 20 mg via INTRAVENOUS
  Administered 2012-09-24: 30 mg via INTRAVENOUS

## 2012-09-24 MED ORDER — ACETAMINOPHEN 650 MG RE SUPP: 650.0000 mg | Freq: Once | RECTAL | Status: AC

## 2012-09-24 MED ORDER — DEXMEDETOMIDINE HCL IN NACL 400 MCG/100ML IV SOLN
0.1000 ug/kg/h | INTRAVENOUS | Status: DC
  Administered 2012-09-24: .2 ug/h via INTRAVENOUS
  Filled 2012-09-24: qty 100

## 2012-09-24 MED ORDER — OXYCODONE HCL 5 MG PO TABS
5.0000 mg | ORAL_TABLET | ORAL | Status: DC | PRN
  Administered 2012-09-24 – 2012-09-27 (×6): 10 mg via ORAL
  Filled 2012-09-24 (×2): qty 2
  Filled 2012-09-24 (×2): qty 1
  Filled 2012-09-24 (×3): qty 2

## 2012-09-24 MED ORDER — LACTATED RINGERS IV SOLN: INTRAVENOUS | Status: DC

## 2012-09-24 MED ORDER — VANCOMYCIN HCL 10 G IV SOLR
1250.0000 mg | INTRAVENOUS | Status: DC
  Administered 2012-09-24: 1250 mg via INTRAVENOUS
  Filled 2012-09-24: qty 1250

## 2012-09-24 MED ORDER — PROTAMINE SULFATE 10 MG/ML IV SOLN
INTRAVENOUS | Status: DC | PRN
  Administered 2012-09-24: 140 mg via INTRAVENOUS

## 2012-09-24 MED ORDER — SODIUM CHLORIDE 0.9 % IV SOLN: 250.0000 mL | INTRAVENOUS | Status: DC

## 2012-09-24 MED ORDER — METOPROLOL TARTRATE 25 MG/10 ML ORAL SUSPENSION
12.5000 mg | Freq: Two times a day (BID) | ORAL | Status: DC
  Filled 2012-09-24 (×4): qty 5

## 2012-09-24 MED ORDER — FENTANYL CITRATE 0.05 MG/ML IJ SOLN
INTRAMUSCULAR | Status: AC
  Administered 2012-09-24: 250 ug via INTRAVENOUS
  Administered 2012-09-24: 200 ug via INTRAVENOUS
  Administered 2012-09-24: 50 ug via INTRAVENOUS
  Administered 2012-09-24: 100 ug via INTRAVENOUS
  Administered 2012-09-24 (×2): 250 ug via INTRAVENOUS
  Administered 2012-09-24: 50 ug via INTRAVENOUS
  Administered 2012-09-24: 100 ug via INTRAVENOUS
  Filled 2012-09-24: qty 2

## 2012-09-24 MED ORDER — MIDAZOLAM HCL 2 MG/2ML IJ SOLN: 2.0000 mg | INTRAMUSCULAR | Status: DC | PRN

## 2012-09-24 MED ORDER — SODIUM CHLORIDE 0.9 % IV SOLN
INTRAVENOUS | Status: DC
  Administered 2012-09-24: 10:00:00 via INTRAVENOUS

## 2012-09-24 MED ORDER — BISACODYL 10 MG RE SUPP: 10.0000 mg | Freq: Every day | RECTAL | Status: DC

## 2012-09-24 MED ORDER — MORPHINE SULFATE 2 MG/ML IJ SOLN
2.0000 mg | INTRAMUSCULAR | Status: DC | PRN
  Administered 2012-09-24 (×2): 2 mg via INTRAVENOUS
  Administered 2012-09-24: 1 mg via INTRAVENOUS
  Administered 2012-09-24: 2 mg via INTRAVENOUS
  Administered 2012-09-24 (×2): 1 mg via INTRAVENOUS
  Administered 2012-09-25 – 2012-09-28 (×5): 2 mg via INTRAVENOUS
  Filled 2012-09-24 (×10): qty 1

## 2012-09-24 MED ORDER — BIVALIRUDIN 250 MG IV SOLR
INTRAVENOUS | Status: AC
  Filled 2012-09-24: qty 250

## 2012-09-24 MED ORDER — VANCOMYCIN HCL IN DEXTROSE 1-5 GM/200ML-% IV SOLN
1000.0000 mg | Freq: Once | INTRAVENOUS | Status: AC
  Administered 2012-09-24: 1000 mg via INTRAVENOUS
  Filled 2012-09-24: qty 200

## 2012-09-24 MED ORDER — POTASSIUM CHLORIDE 2 MEQ/ML IV SOLN
80.0000 meq | INTRAVENOUS | Status: DC
  Filled 2012-09-24: qty 40

## 2012-09-24 MED ORDER — ACETAMINOPHEN 500 MG PO TABS
1000.0000 mg | ORAL_TABLET | Freq: Four times a day (QID) | ORAL | Status: DC
  Administered 2012-09-24 – 2012-09-28 (×12): 1000 mg via ORAL
  Filled 2012-09-24 (×19): qty 2

## 2012-09-24 MED ORDER — LIDOCAINE HCL (CARDIAC) 20 MG/ML IV SOLN
INTRAVENOUS | Status: DC | PRN
  Administered 2012-09-24: 60 mg via INTRAVENOUS

## 2012-09-24 MED ORDER — DEXMEDETOMIDINE HCL IN NACL 200 MCG/50ML IV SOLN: 0.1000 ug/kg/h | INTRAVENOUS | Status: DC

## 2012-09-24 MED ORDER — POTASSIUM CHLORIDE 10 MEQ/50ML IV SOLN
10.0000 meq | INTRAVENOUS | Status: AC
  Administered 2012-09-24 (×3): 10 meq via INTRAVENOUS

## 2012-09-24 MED ORDER — NITROGLYCERIN 0.2 MG/ML ON CALL CATH LAB
INTRAVENOUS | Status: AC
  Filled 2012-09-24: qty 1

## 2012-09-24 MED ORDER — SODIUM CHLORIDE 0.9 % IV SOLN
INTRAVENOUS | Status: DC
  Administered 2012-09-24: .7 [IU]/h via INTRAVENOUS
  Filled 2012-09-24: qty 1

## 2012-09-24 MED ORDER — MAGNESIUM SULFATE 40 MG/ML IJ SOLN
4.0000 g | Freq: Once | INTRAMUSCULAR | Status: AC
  Administered 2012-09-24: 4 g via INTRAVENOUS
  Filled 2012-09-24: qty 100

## 2012-09-24 MED ORDER — PLASMA-LYTE 148 IV SOLN
INTRAVENOUS | Status: DC
  Filled 2012-09-24: qty 2.5

## 2012-09-24 MED ORDER — SODIUM CHLORIDE 0.9 % IV SOLN
INTRAVENOUS | Status: DC
  Administered 2012-09-24: 70 mL/h via INTRAVENOUS
  Filled 2012-09-24: qty 40

## 2012-09-24 MED ORDER — ETOMIDATE 2 MG/ML IV SOLN
INTRAVENOUS | Status: DC | PRN
  Administered 2012-09-24: 12 mg via INTRAVENOUS

## 2012-09-24 MED ORDER — MORPHINE SULFATE 2 MG/ML IJ SOLN
1.0000 mg | INTRAMUSCULAR | Status: AC | PRN
  Administered 2012-09-24 (×4): 0.5 mg via INTRAVENOUS
  Filled 2012-09-24: qty 1

## 2012-09-24 SURGICAL SUPPLY — 96 items
ATTRACTOMAT 16X20 MAGNETIC DRP (DRAPES) ×3 IMPLANT
BAG DECANTER FOR FLEXI CONT (MISCELLANEOUS) ×3 IMPLANT
BANDAGE ELASTIC 4 VELCRO ST LF (GAUZE/BANDAGES/DRESSINGS) ×3 IMPLANT
BANDAGE ELASTIC 6 VELCRO ST LF (GAUZE/BANDAGES/DRESSINGS) ×3 IMPLANT
BANDAGE GAUZE ELAST BULKY 4 IN (GAUZE/BANDAGES/DRESSINGS) ×3 IMPLANT
BASKET HEART (ORDER IN 25'S) (MISCELLANEOUS) ×1
BASKET HEART (ORDER IN 25S) (MISCELLANEOUS) ×2 IMPLANT
BLADE STERNUM SYSTEM 6 (BLADE) ×3 IMPLANT
CANISTER SUCTION 2500CC (MISCELLANEOUS) ×3 IMPLANT
CANNULA VENOUS LOW PROF 34X46 (CANNULA) ×3 IMPLANT
CATH ROBINSON RED A/P 18FR (CATHETERS) ×6 IMPLANT
CATH THORACIC 28FR (CATHETERS) ×3 IMPLANT
CATH THORACIC 28FR RT ANG (CATHETERS) IMPLANT
CATH THORACIC 36FR (CATHETERS) ×3 IMPLANT
CATH THORACIC 36FR RT ANG (CATHETERS) ×3 IMPLANT
CLIP TI MEDIUM 24 (CLIP) IMPLANT
CLIP TI WIDE RED SMALL 24 (CLIP) IMPLANT
CLOTH BEACON ORANGE TIMEOUT ST (SAFETY) ×3 IMPLANT
COVER SURGICAL LIGHT HANDLE (MISCELLANEOUS) ×3 IMPLANT
CRADLE DONUT ADULT HEAD (MISCELLANEOUS) ×3 IMPLANT
DRAPE CARDIOVASCULAR INCISE (DRAPES) ×2
DRAPE SLUSH/WARMER DISC (DRAPES) IMPLANT
DRAPE SRG 135X102X78XABS (DRAPES) ×2 IMPLANT
DRSG COVADERM 4X14 (GAUZE/BANDAGES/DRESSINGS) ×3 IMPLANT
ELECT CAUTERY BLADE 6.4 (BLADE) ×3 IMPLANT
ELECT REM PT RETURN 9FT ADLT (ELECTROSURGICAL) ×4
ELECTRODE REM PT RTRN 9FT ADLT (ELECTROSURGICAL) ×4 IMPLANT
GLOVE BIO SURGEON STRL SZ 6 (GLOVE) IMPLANT
GLOVE BIO SURGEON STRL SZ 6.5 (GLOVE) IMPLANT
GLOVE BIO SURGEON STRL SZ7 (GLOVE) IMPLANT
GLOVE BIO SURGEON STRL SZ7.5 (GLOVE) IMPLANT
GLOVE BIOGEL PI IND STRL 6 (GLOVE) IMPLANT
GLOVE BIOGEL PI IND STRL 6.5 (GLOVE) IMPLANT
GLOVE BIOGEL PI IND STRL 7.0 (GLOVE) IMPLANT
GLOVE BIOGEL PI INDICATOR 6 (GLOVE)
GLOVE BIOGEL PI INDICATOR 6.5 (GLOVE)
GLOVE BIOGEL PI INDICATOR 7.0 (GLOVE)
GLOVE EUDERMIC 7 POWDERFREE (GLOVE) ×6 IMPLANT
GLOVE ORTHO TXT STRL SZ7.5 (GLOVE) IMPLANT
GOWN PREVENTION PLUS XLARGE (GOWN DISPOSABLE) ×3 IMPLANT
GOWN STRL NON-REIN LRG LVL3 (GOWN DISPOSABLE) ×12 IMPLANT
HEMOSTAT POWDER SURGIFOAM 1G (HEMOSTASIS) ×9 IMPLANT
HEMOSTAT SURGICEL 2X14 (HEMOSTASIS) ×3 IMPLANT
INSERT FOGARTY 61MM (MISCELLANEOUS) IMPLANT
INSERT FOGARTY XLG (MISCELLANEOUS) IMPLANT
KIT BASIN OR (CUSTOM PROCEDURE TRAY) ×3 IMPLANT
KIT CATH CPB BARTLE (MISCELLANEOUS) ×3 IMPLANT
KIT ROOM TURNOVER OR (KITS) ×3 IMPLANT
KIT SUCTION CATH 14FR (SUCTIONS) ×3 IMPLANT
KIT VASOVIEW W/TROCAR VH 2000 (KITS) ×3 IMPLANT
NS IRRIG 1000ML POUR BTL (IV SOLUTION) ×15 IMPLANT
PACK OPEN HEART (CUSTOM PROCEDURE TRAY) ×3 IMPLANT
PAD ARMBOARD 7.5X6 YLW CONV (MISCELLANEOUS) ×6 IMPLANT
PAD ELECT DEFIB RADIOL ZOLL (MISCELLANEOUS) ×3 IMPLANT
PENCIL BUTTON HOLSTER BLD 10FT (ELECTRODE) ×3 IMPLANT
PUNCH AORTIC ROTATE 4.0MM (MISCELLANEOUS) IMPLANT
PUNCH AORTIC ROTATE 4.5MM 8IN (MISCELLANEOUS) ×3 IMPLANT
PUNCH AORTIC ROTATE 5MM 8IN (MISCELLANEOUS) IMPLANT
SPONGE GAUZE 4X4 12PLY (GAUZE/BANDAGES/DRESSINGS) ×6 IMPLANT
SPONGE INTESTINAL PEANUT (DISPOSABLE) IMPLANT
SPONGE LAP 18X18 X RAY DECT (DISPOSABLE) IMPLANT
SPONGE LAP 4X18 X RAY DECT (DISPOSABLE) ×3 IMPLANT
SUT BONE WAX W31G (SUTURE) ×3 IMPLANT
SUT MNCRL AB 4-0 PS2 18 (SUTURE) IMPLANT
SUT PROLENE 3 0 SH DA (SUTURE) IMPLANT
SUT PROLENE 3 0 SH1 36 (SUTURE) ×3 IMPLANT
SUT PROLENE 4 0 RB 1 (SUTURE)
SUT PROLENE 4 0 SH DA (SUTURE) IMPLANT
SUT PROLENE 4-0 RB1 .5 CRCL 36 (SUTURE) IMPLANT
SUT PROLENE 5 0 C 1 36 (SUTURE) IMPLANT
SUT PROLENE 6 0 C 1 30 (SUTURE) IMPLANT
SUT PROLENE 7 0 BV 1 (SUTURE) IMPLANT
SUT PROLENE 7 0 BV1 MDA (SUTURE) ×3 IMPLANT
SUT PROLENE 8 0 BV175 6 (SUTURE) IMPLANT
SUT SILK  1 MH (SUTURE)
SUT SILK 1 MH (SUTURE) IMPLANT
SUT STEEL STERNAL CCS#1 18IN (SUTURE) IMPLANT
SUT STEEL SZ 6 DBL 3X14 BALL (SUTURE) IMPLANT
SUT VIC AB 1 CTX 36 (SUTURE) ×4
SUT VIC AB 1 CTX36XBRD ANBCTR (SUTURE) ×4 IMPLANT
SUT VIC AB 2-0 CT1 27 (SUTURE)
SUT VIC AB 2-0 CT1 TAPERPNT 27 (SUTURE) IMPLANT
SUT VIC AB 2-0 CTX 27 (SUTURE) IMPLANT
SUT VIC AB 3-0 SH 27 (SUTURE)
SUT VIC AB 3-0 SH 27X BRD (SUTURE) IMPLANT
SUT VIC AB 3-0 X1 27 (SUTURE) IMPLANT
SUT VICRYL 4-0 PS2 18IN ABS (SUTURE) IMPLANT
SUTURE E-PAK OPEN HEART (SUTURE) ×3 IMPLANT
SYSTEM SAHARA CHEST DRAIN ATS (WOUND CARE) ×3 IMPLANT
TOWEL OR 17X24 6PK STRL BLUE (TOWEL DISPOSABLE) ×3 IMPLANT
TOWEL OR 17X26 10 PK STRL BLUE (TOWEL DISPOSABLE) ×3 IMPLANT
TRAY FOLEY IC TEMP SENS 14FR (CATHETERS) ×3 IMPLANT
TUBE SUCT INTRACARD DLP 20F (MISCELLANEOUS) ×3 IMPLANT
TUBING INSUFFLATION 10FT LAP (TUBING) ×3 IMPLANT
UNDERPAD 30X30 INCONTINENT (UNDERPADS AND DIAPERS) ×3 IMPLANT
WATER STERILE IRR 1000ML POUR (IV SOLUTION) ×6 IMPLANT

## 2012-09-24 SURGICAL SUPPLY — 111 items
ADH SKN CLS APL DERMABOND .7 (GAUZE/BANDAGES/DRESSINGS) ×3
APPLICATOR COTTON TIP 6IN STRL (MISCELLANEOUS) ×1 IMPLANT
ATTRACTOMAT 16X20 MAGNETIC DRP (DRAPES) ×4 IMPLANT
BAG DECANTER FOR FLEXI CONT (MISCELLANEOUS) ×4 IMPLANT
BANDAGE ELASTIC 4 VELCRO ST LF (GAUZE/BANDAGES/DRESSINGS) ×4 IMPLANT
BANDAGE ELASTIC 6 VELCRO ST LF (GAUZE/BANDAGES/DRESSINGS) ×4 IMPLANT
BANDAGE GAUZE ELAST BULKY 4 IN (GAUZE/BANDAGES/DRESSINGS) ×4 IMPLANT
BASKET HEART (ORDER IN 25'S) (MISCELLANEOUS) ×1
BASKET HEART (ORDER IN 25S) (MISCELLANEOUS) ×3 IMPLANT
BLADE STERNUM SYSTEM 6 (BLADE) ×4 IMPLANT
BLADE SURG 11 STRL SS (BLADE) ×1 IMPLANT
CANISTER SUCTION 2500CC (MISCELLANEOUS) ×4 IMPLANT
CANNULA VENOUS LOW PROF 34X46 (CANNULA) ×4 IMPLANT
CATH ROBINSON RED A/P 18FR (CATHETERS) ×8 IMPLANT
CATH THORACIC 28FR (CATHETERS) ×5 IMPLANT
CATH THORACIC 28FR RT ANG (CATHETERS) IMPLANT
CATH THORACIC 36FR (CATHETERS) ×4 IMPLANT
CATH THORACIC 36FR RT ANG (CATHETERS) ×4 IMPLANT
CLIP TI MEDIUM 24 (CLIP) IMPLANT
CLIP TI WIDE RED SMALL 24 (CLIP) ×1 IMPLANT
CLOTH BEACON ORANGE TIMEOUT ST (SAFETY) ×4 IMPLANT
CONN ST 1/4X3/8  BEN (MISCELLANEOUS) ×2
CONN ST 1/4X3/8 BEN (MISCELLANEOUS) IMPLANT
CONN Y 3/8X3/8X3/8  BEN (MISCELLANEOUS) ×1
CONN Y 3/8X3/8X3/8 BEN (MISCELLANEOUS) IMPLANT
COVER SURGICAL LIGHT HANDLE (MISCELLANEOUS) ×5 IMPLANT
CRADLE DONUT ADULT HEAD (MISCELLANEOUS) ×4 IMPLANT
DERMABOND ADVANCED (GAUZE/BANDAGES/DRESSINGS) ×1
DERMABOND ADVANCED .7 DNX12 (GAUZE/BANDAGES/DRESSINGS) IMPLANT
DRAPE CARDIOVASCULAR INCISE (DRAPES) ×4
DRAPE SLUSH/WARMER DISC (DRAPES) ×1 IMPLANT
DRAPE SRG 135X102X78XABS (DRAPES) ×3 IMPLANT
DRSG COVADERM 4X14 (GAUZE/BANDAGES/DRESSINGS) ×4 IMPLANT
ELECT CAUTERY BLADE 6.4 (BLADE) ×4 IMPLANT
ELECT REM PT RETURN 9FT ADLT (ELECTROSURGICAL) ×8
ELECTRODE REM PT RTRN 9FT ADLT (ELECTROSURGICAL) ×6 IMPLANT
GLOVE BIO SURGEON STRL SZ 6 (GLOVE) ×2 IMPLANT
GLOVE BIO SURGEON STRL SZ 6.5 (GLOVE) ×1 IMPLANT
GLOVE BIO SURGEON STRL SZ7 (GLOVE) ×4 IMPLANT
GLOVE BIO SURGEON STRL SZ7.5 (GLOVE) IMPLANT
GLOVE BIOGEL PI IND STRL 6 (GLOVE) IMPLANT
GLOVE BIOGEL PI IND STRL 6.5 (GLOVE) IMPLANT
GLOVE BIOGEL PI IND STRL 7.0 (GLOVE) IMPLANT
GLOVE BIOGEL PI INDICATOR 6 (GLOVE)
GLOVE BIOGEL PI INDICATOR 6.5 (GLOVE) ×4
GLOVE BIOGEL PI INDICATOR 7.0 (GLOVE) ×4
GLOVE EUDERMIC 7 POWDERFREE (GLOVE) ×9 IMPLANT
GLOVE ORTHO TXT STRL SZ7.5 (GLOVE) IMPLANT
GOWN PREVENTION PLUS XLARGE (GOWN DISPOSABLE) ×6 IMPLANT
GOWN STRL NON-REIN LRG LVL3 (GOWN DISPOSABLE) ×18 IMPLANT
HEMOSTAT POWDER SURGIFOAM 1G (HEMOSTASIS) ×12 IMPLANT
HEMOSTAT SURGICEL 2X14 (HEMOSTASIS) ×4 IMPLANT
INSERT FOGARTY 61MM (MISCELLANEOUS) IMPLANT
INSERT FOGARTY XLG (MISCELLANEOUS) IMPLANT
KIT BASIN OR (CUSTOM PROCEDURE TRAY) ×4 IMPLANT
KIT CATH CPB BARTLE (MISCELLANEOUS) ×4 IMPLANT
KIT ROOM TURNOVER OR (KITS) ×4 IMPLANT
KIT SUCTION CATH 14FR (SUCTIONS) ×4 IMPLANT
KIT VASOVIEW W/TROCAR VH 2000 (KITS) ×4 IMPLANT
NS IRRIG 1000ML POUR BTL (IV SOLUTION) ×21 IMPLANT
PACK OPEN HEART (CUSTOM PROCEDURE TRAY) ×4 IMPLANT
PAD ARMBOARD 7.5X6 YLW CONV (MISCELLANEOUS) ×8 IMPLANT
PAD ELECT DEFIB RADIOL ZOLL (MISCELLANEOUS) ×4 IMPLANT
PENCIL BUTTON HOLSTER BLD 10FT (ELECTRODE) ×4 IMPLANT
PUNCH AORTIC ROTATE 4.0MM (MISCELLANEOUS) ×1 IMPLANT
PUNCH AORTIC ROTATE 4.5MM 8IN (MISCELLANEOUS) ×4 IMPLANT
PUNCH AORTIC ROTATE 5MM 8IN (MISCELLANEOUS) IMPLANT
SET CARDIOPLEGIA MPS 5001102 (MISCELLANEOUS) ×1 IMPLANT
SOLUTION ANTI FOG 6CC (MISCELLANEOUS) ×1 IMPLANT
SPONGE GAUZE 4X4 12PLY (GAUZE/BANDAGES/DRESSINGS) ×8 IMPLANT
SPONGE INTESTINAL PEANUT (DISPOSABLE) IMPLANT
SPONGE LAP 18X18 X RAY DECT (DISPOSABLE) ×1 IMPLANT
SPONGE LAP 4X18 X RAY DECT (DISPOSABLE) ×4 IMPLANT
SUT BONE WAX W31G (SUTURE) ×4 IMPLANT
SUT ETHILON 3 0 FSL (SUTURE) ×1 IMPLANT
SUT MNCRL AB 4-0 PS2 18 (SUTURE) IMPLANT
SUT PROLENE 3 0 SH DA (SUTURE) IMPLANT
SUT PROLENE 3 0 SH1 36 (SUTURE) ×5 IMPLANT
SUT PROLENE 4 0 RB 1 (SUTURE)
SUT PROLENE 4 0 SH DA (SUTURE) IMPLANT
SUT PROLENE 4-0 RB1 .5 CRCL 36 (SUTURE) IMPLANT
SUT PROLENE 5 0 C 1 36 (SUTURE) IMPLANT
SUT PROLENE 6 0 C 1 30 (SUTURE) IMPLANT
SUT PROLENE 7 0 BV 1 (SUTURE) IMPLANT
SUT PROLENE 7 0 BV1 MDA (SUTURE) ×5 IMPLANT
SUT PROLENE 8 0 BV175 6 (SUTURE) IMPLANT
SUT SILK  1 MH (SUTURE) ×1
SUT SILK 1 MH (SUTURE) IMPLANT
SUT STEEL 6MS V (SUTURE) ×2 IMPLANT
SUT STEEL STERNAL CCS#1 18IN (SUTURE) IMPLANT
SUT STEEL SZ 6 DBL 3X14 BALL (SUTURE) IMPLANT
SUT VIC AB 1 CTX 36 (SUTURE) ×8
SUT VIC AB 1 CTX36XBRD ANBCTR (SUTURE) ×6 IMPLANT
SUT VIC AB 2-0 CT1 27 (SUTURE) ×4
SUT VIC AB 2-0 CT1 TAPERPNT 27 (SUTURE) IMPLANT
SUT VIC AB 2-0 CTX 27 (SUTURE) IMPLANT
SUT VIC AB 3-0 SH 27 (SUTURE)
SUT VIC AB 3-0 SH 27X BRD (SUTURE) IMPLANT
SUT VIC AB 3-0 X1 27 (SUTURE) IMPLANT
SUT VICRYL 4-0 PS2 18IN ABS (SUTURE) ×1 IMPLANT
SUTURE E-PAK OPEN HEART (SUTURE) ×4 IMPLANT
SYSTEM SAHARA CHEST DRAIN ATS (WOUND CARE) ×4 IMPLANT
TAPE CLOTH SURG 4X10 WHT LF (GAUZE/BANDAGES/DRESSINGS) ×1 IMPLANT
TAPE PAPER 2X10 WHT MICROPORE (GAUZE/BANDAGES/DRESSINGS) ×1 IMPLANT
TOWEL OR 17X24 6PK STRL BLUE (TOWEL DISPOSABLE) ×4 IMPLANT
TOWEL OR 17X26 10 PK STRL BLUE (TOWEL DISPOSABLE) ×4 IMPLANT
TRAY FOLEY IC TEMP SENS 14FR (CATHETERS) ×4 IMPLANT
TUBE SUCT INTRACARD DLP 20F (MISCELLANEOUS) ×4 IMPLANT
TUBING INSUFFLATION 10FT LAP (TUBING) ×4 IMPLANT
UNDERPAD 30X30 INCONTINENT (UNDERPADS AND DIAPERS) ×4 IMPLANT
WATER STERILE IRR 1000ML POUR (IV SOLUTION) ×8 IMPLANT

## 2012-09-24 NOTE — Anesthesia Procedure Notes (Signed)
Procedures Procedures: Right IJ [name] Todd Catheter Insertion: [id number]: The patient was identified and consent obtained.  TO was performed, and full barrier precautions were used.  The skin was anesthetized with lidocaine-4cc plain with 25g needle.  Once the vein was located with the 22 ga. needle using ultrasound guidance , the wire was inserted into the vein.  The wire location was confirmed with ultrasound.  The tissue was dilated and the 8.5 [place] cordis catheter was carefully inserted. Afterwards [name] Todd catheter was inserted. PA catheter at 45cm.  The patient tolerated the procedure well.   CE

## 2012-09-24 NOTE — ED Notes (Signed)
Patient coming from home. Initially started c/o CP at 12:30-1 p.m. yesterday. Pain eventually went away. States pain came back again at 0145. Pt vomited on scene. EMS administered 324 asa, 2 NTG, 4 morphine, and 4 Zofran. Pain remains at a 8/10. Ax4, NAD.

## 2012-09-24 NOTE — H&P (Addendum)
Aneta Hendershott is an 67 y.o. female.   Chief Complaint: chest pain HPI: 67 yo woman with PMH of hypertension and tobacco use who woke up between 11:30 and 12:00 with some chest pain. She tried to go back to sleep but ultimately called EMS. She had an ECG in the field revealing inferior ST elevation with lateral ST depression leading EMS to call STEMI Code. On arrival to ER, Ms. Elbert had received 324 mg aspirin, 2 SL NTG that took the edge off her CP from 9/10 to 7/10. She characterized the pain as bad indigestion, substernal, some dull back pain but no radiation to back. She had some associated shortness of breath, nausea/vomiting. She received 50 mcg fentanyl that helped her pain down to 4/10. She had almost the exact bp in both arms 98-101/58 in both arms and chest x-ray with stable mediastinum so she received 4000 units of heparin in the ER. STEMI team arrived and she went to the Cath lab.   Past Medical History  Diagnosis Date  . Dysmenorrhea   . Breast mass   . Estrogen deficiency   . Increased blood pressure (not hypertension)   . Endometriosis   . Hypertension     Past Surgical History  Procedure Laterality Date  . Abdominal hysterectomy    . Back surgery      Family History  Problem Relation Age of Onset  . Diabetes Maternal Grandmother   no known family history of CAD or CHF  Social History:  reports that she has been smoking Cigarettes.  She has been smoking about 0.00 packs per day. She has never used smokeless tobacco. She reports that she does not drink alcohol or use illicit drugs.  Allergies: No Known Allergies  Medications Prior to Admission  Medication Sig Dispense Refill  . aliskiren (TEKTURNA) 150 MG tablet Take 150 mg by mouth daily.      Marland Kitchen amLODipine (NORVASC) 5 MG tablet Take 5 mg by mouth daily.      Marland Kitchen estradiol (CLIMARA - DOSED IN MG/24 HR) 0.1 mg/24hr Place 1 patch (0.1 mg total) onto the skin once a week.  4 patch  12  . estradiol (ESTRACE) 1 MG tablet Take  1 tablet (1 mg total) by mouth daily.  30 tablet  9  . estradiol (VIVELLE-DOT) 0.1 MG/24HR Place 1 patch (0.1 mg total) onto the skin 2 (two) times a week. 1 x weekly  PLEASE NOTE SIG CHANGE  8 patch  11  . lisinopril (PRINIVIL,ZESTRIL) 10 MG tablet Take 10 mg by mouth daily.      . NON FORMULARY BP med pt unsure of the name Pt will call back with information      . oxyCODONE-acetaminophen (ROXICET) 5-325 MG per tablet Take 1 tablet by mouth every 4 (four) hours as needed for pain.  12 tablet  0    Results for orders placed during the hospital encounter of 09/24/12 (from the past 48 hour(s))  CBC     Status: Abnormal   Collection Time    09/24/12  2:32 AM      Result Value Range   WBC 5.3  4.0 - 10.5 K/uL   RBC 3.65 (*) 3.87 - 5.11 MIL/uL   Hemoglobin 11.9 (*) 12.0 - 15.0 g/dL   HCT 78.2 (*) 95.6 - 21.3 %   MCV 92.6  78.0 - 100.0 fL   MCH 32.6  26.0 - 34.0 pg   MCHC 35.2  30.0 - 36.0 g/dL   RDW  14.0  11.5 - 15.5 %   Platelets 222  150 - 400 K/uL  DIFFERENTIAL     Status: Abnormal   Collection Time    09/24/12  2:32 AM      Result Value Range   Neutrophils Relative % 27 (*) 43 - 77 %   Neutro Abs 1.4 (*) 1.7 - 7.7 K/uL   Lymphocytes Relative 54 (*) 12 - 46 %   Lymphs Abs 2.9  0.7 - 4.0 K/uL   Monocytes Relative 12  3 - 12 %   Monocytes Absolute 0.7  0.1 - 1.0 K/uL   Eosinophils Relative 6 (*) 0 - 5 %   Eosinophils Absolute 0.3  0.0 - 0.7 K/uL   Basophils Relative 1  0 - 1 %   Basophils Absolute 0.1  0.0 - 0.1 K/uL  PROTIME-INR     Status: None   Collection Time    09/24/12  2:32 AM      Result Value Range   Prothrombin Time 12.3  11.6 - 15.2 seconds   INR 0.93  0.00 - 1.49  APTT     Status: None   Collection Time    09/24/12  2:32 AM      Result Value Range   aPTT 29  24 - 37 seconds  BASIC METABOLIC PANEL     Status: Abnormal   Collection Time    09/24/12  2:32 AM      Result Value Range   Sodium 138  135 - 145 mEq/L   Potassium 3.5  3.5 - 5.1 mEq/L   Chloride 98   96 - 112 mEq/L   CO2 29  19 - 32 mEq/L   Glucose, Bld 135 (*) 70 - 99 mg/dL   BUN 25 (*) 6 - 23 mg/dL   Creatinine, Ser 4.09 (*) 0.50 - 1.10 mg/dL   Calcium 9.4  8.4 - 81.1 mg/dL   GFR calc non Af Amer 42 (*) >90 mL/min   GFR calc Af Amer 49 (*) >90 mL/min   Comment:            The eGFR has been calculated     using the CKD EPI equation.     This calculation has not been     validated in all clinical     situations.     eGFR's persistently     <90 mL/min signify     possible Chronic Kidney Disease.   Dg Chest Portable 1 View  09/24/2012   *RADIOLOGY REPORT*  Clinical Data: Code STEMI.  PORTABLE CHEST - 1 VIEW  Comparison: Plain film chest 03/08/2007.  Findings: Lungs are clear.  Heart size is normal.  No pneumothorax or pleural effusion.  IMPRESSION: Negative chest.   Original Report Authenticated By: Holley Dexter, M.D.    Review of Systems  Constitutional: Positive for diaphoresis. Negative for fever and chills.  HENT: Negative for hearing loss and neck pain.   Eyes: Negative for double vision, photophobia and pain.  Respiratory: Negative for cough, hemoptysis and shortness of breath.   Cardiovascular: Positive for chest pain, leg swelling and PND. Negative for palpitations.  Gastrointestinal: Positive for nausea and vomiting. Negative for abdominal pain, diarrhea, constipation, blood in stool and melena.  Genitourinary: Negative for frequency and hematuria.  Musculoskeletal: Negative for myalgias.  Skin: Negative for rash.  Neurological: Negative for dizziness, tingling, tremors and headaches.  Endo/Heme/Allergies: Negative for environmental allergies. Does not bruise/bleed easily.  Psychiatric/Behavioral: Negative for depression, suicidal ideas and  hallucinations.    Blood pressure 99/68, pulse 94, temperature 97.4 F (36.3 C), temperature source Oral, resp. rate 16, SpO2 99.00%. Physical Exam  Nursing note and vitals reviewed. Constitutional: She is oriented to person,  place, and time. She appears well-developed and well-nourished. She appears distressed.  HENT:  Head: Normocephalic and atraumatic.  Nose: Nose normal.  Mouth/Throat: Oropharynx is clear and moist.  Eyes: Conjunctivae and EOM are normal. Pupils are equal, round, and reactive to light. No scleral icterus.  Neck: Normal range of motion. Neck supple. No JVD present. No tracheal deviation present.  Cardiovascular: Normal rate, normal heart sounds and intact distal pulses.   No murmur heard. Respiratory: Effort normal. No respiratory distress. She has wheezes. She has no rales. She exhibits no tenderness.  GI: Soft. Bowel sounds are normal. She exhibits no distension. There is no tenderness.  Musculoskeletal: Normal range of motion. She exhibits no edema and no tenderness.  Neurological: She is alert and oriented to person, place, and time. No cranial nerve deficit. Coordination normal.  Skin: Skin is warm. No rash noted. She is diaphoretic. No erythema.  Psychiatric: She has a normal mood and affect. Her behavior is normal. Thought content normal.  Labs drawn; resulted while writing the note, K 3.5, Cr 1.3  ECG reviewed from field and in the ER: inferior ST elevation; lateral ST depression, ? Atrial fibrillation Monitor in the cath lab with NSR  Problem lab Inferior STEMI/Chest Pain Hypertension Chronic Kidney Disease Stage III  ? Atrial fibrillation (perhaps just with CP/STEMI) as in NSR in lab (with some bradycardia) Tobacco abuse Mild Anemia   Assessment/Plan 67 yo woman with PMH of hypertension, tobacco abuse here with chest pain/inferior STEMI who received aspirin 324 mg, heparin gtt and cath lab activation currently in the cath lab. Risk factor include age, tobacco use, hypertension (no known family history). She's received aspirin/heparin gtt and initial angiogram with total RCA (IRA) with significant left to right collaterals and tight proximal LAD lesion. CVTS consult in the lab by  Dr. Allyson Sabal for potential emergent CABG.  - aspirin 81 mg daily, atorvastatin 80 mg qHS, first dose now - NPO - TSH, BNP, hba1c, lipid panel - initial admission orders placed Addendum:  High grade proximal LAD lesion not amenable to PCI and 100% RCA opened with balloon angioplasty with severe diffuse disease, emergent cardiothoracic surgery consultation and now plans for emergency CABG of RCA and LAD.   Laurina Fischl 09/24/2012, 3:29 AM

## 2012-09-24 NOTE — Op Note (Signed)
CARDIOVASCULAR SURGERY OPERATIVE NOTE  09/24/2012  Surgeon:  Alleen Borne, MD  First Assistant: Lowella Dandy, [facility]   Preoperative Diagnosis:  Severe multi-vessel coronary artery disease   Postoperative Diagnosis:  Same   Procedure:  1. Median Sternotomy 2. Extracorporeal circulation 3.   Coronary artery bypass grafting x 4   Left internal mammary graft to the LAD  Sequential SVG to Diag and OM  SVG to PDA  4.   Endoscopic vein harvest from the right leg   Anesthesia:  General Endotracheal   Clinical History/Surgical Indication:  67 year old smoker with HTN who woke up between 11:30 -12:00 tonight with chest pain. She could not go back to sleep due to worsening pain and called EMS. ECG in field showed inferior STEMI. Her pain improved with ASA and 2 SL NTG, fentanyl. She received no Plavix or other anticoagulants. Cath shows occluded RCA with high grade proximal LAD and OM stenoses. Flow was reestablished down the RCA but it is severely and diffusely diseased throughout the main vessel. PDA looks ok. She has continued to have back pain. LAD not amenable to PCI. I agree that emergent CABG is the best treatment. I discussed the procedure with the patient and she is agreeable to proceed. She does not have any family with her and does want her family notified. I discussed the operative procedure with the patient and family including alternatives, benefits and risks; including but not limited to bleeding, blood transfusion, infection, stroke, myocardial infarction, graft failure, heart block requiring a permanent pacemaker, organ dysfunction, and death.  [name] Todd understands and agrees to proceed.     Preparation:  The patient was seen in the preoperative holding area and the correct patient, correct operation were confirmed with the patient after reviewing the medical record and  catheterization. The consent was signed by me. Preoperative antibiotics were given. A pulmonary arterial line and radial arterial line were placed by the anesthesia team. The patient was taken back to the operating room and positioned supine on the operating room table. After being placed under general endotracheal anesthesia by the anesthesia team a foley catheter was placed. The neck, chest, abdomen, and both legs were prepped with betadine soap and solution and draped in the usual sterile manner. A surgical time-out was taken and the correct patient and operative procedure were confirmed with the nursing and anesthesia staff.   Cardiopulmonary Bypass:  A median sternotomy was performed. The pericardium was opened in the midline. Right ventricular function appeared normal. The ascending aorta was of normal size and had no palpable plaque. There were no contraindications to aortic cannulation or cross-clamping. The patient was fully systemically heparinized and the ACT was maintained > 400 sec. The proximal aortic arch was cannulated with a 20 F aortic cannula for arterial inflow. Venous cannulation was performed via the right atrial appendage using a two-staged venous cannula. An antegrade cardioplegia/vent cannula was inserted into the mid-ascending aorta. Aortic occlusion was performed with a single cross-clamp. Systemic cooling to 32 degrees Centigrade and topical cooling of the heart with iced saline were used. Hyperkalemic antegrade cold blood cardioplegia was used to induce diastolic arrest and was then given at about 20 minute intervals throughout the period of arrest to maintain myocardial temperature at or below 10 degrees centigrade. A temperature probe was inserted into the interventricular septum and an insulating pad was placed in the pericardium.   Left internal mammary harvest:  The left side of the sternum was retracted using the  Rultract retractor. The left internal mammary artery was  harvested as a pedicle graft. All side branches were clipped. It was a small-medium-sized vessel of good quality with good blood flow. It was ligated distally and divided. It was sprayed with topical papaverine solution to prevent vasospasm.   Endoscopic vein harvest:  The right greater saphenous vein was harvested endoscopically through a 2 cm incision medial to the right knee. It was harvested from the upper thigh to below the knee. It was a small-medium-sized vein of good quality. The side branches were all ligated with 4-0 silk ties.    Coronary arteries:  The coronary arteries were examined.   LAD:  Heavily disease proximally with segmental plaque in the midportion.   Diagonal: large, heavily diseased proximally  LCX:  OM medium sized with some proximal disease  RCA:  Severely and diffusely diseased. The PDA and PL branches had no significant disease   Grafts:  1. LIMA to the LAD: 1.75 mm. It was sewn end to side using 8-0 prolene continuous suture. 2. Sequential SVG to diag:  1.6 mm. It was sewn side to side using 7-0 prolene continuous suture. 3. Sequential SVG to OM:  1.5 mm. It was sewn end to side using 7-0 prolene continuous suture. 4. SVG to PDA:  1.6 mm. It was sewn end to side using 7-0 prolene continuous suture.  The proximal vein graft anastomoses were performed to the mid-ascending aorta using continuous 6-0 prolene suture. Graft markers were placed around the proximal anastomoses.   Completion:  The patient was rewarmed to 37 degrees Centigrade. The clamp was removed from the LIMA pedicle and there was rapid warming of the septum and return of ventricular fibrillation. The crossclamp was removed with a time of 75 minutes. There was spontaneous return of sinus rhythm. The distal and proximal anastomoses were checked for hemostasis. The position of the grafts was satisfactory. Two temporary epicardial pacing wires were placed on the right atrium and two on the right  ventricle. The patient was weaned from CPB without difficulty on no inotropes. CPB time was 89 minutes. Cardiac output was 5 LPM. Heparin was fully reversed with protamine and the aortic and venous cannulas removed. Hemostasis was achieved. Mediastinal and left pleural drainage tubes were placed. The sternum was closed with double #6 stainless steel wires. The fascia was closed with continuous # 1 vicryl suture. The subcutaneous tissue was closed with 2-0 vicryl continuous suture. The skin was closed with 3-0 vicryl subcuticular suture. All sponge, needle, and instrument counts were reported correct at the end of the case. Dry sterile dressings were placed over the incisions and around the chest tubes which were connected to pleurevac suction. The patient was then transported to the surgical intensive care unit in critical but stable condition.

## 2012-09-24 NOTE — Progress Notes (Signed)
T. CTS p.m. Rounds  Status post emergency CABG Extubated with stable hemodynamics Neo-Synephrine at 45 mics 6 hour labs--hematocrit 32

## 2012-09-24 NOTE — ED Notes (Signed)
4,000 Units of Heparin Administered IV via Rapid Response RN.

## 2012-09-24 NOTE — Brief Op Note (Signed)
09/24/2012  8:26 AM  PATIENT:  [name] Todd  67 y.o. female  PRE-OPERATIVE DIAGNOSIS:  CAD  POST-OPERATIVE DIAGNOSIS:  CAD  PROCEDURE:  Procedure(s): CORONARY ARTERY BYPASS GRAFTING x 4 -LIMA to LAD -SEQUENTIAL SVG TO OM1 AND DIAGONAL 1 -SVG TO PDA  ENDOVEIN HARVEST OF GREATER SAPHENOUS VEIN RIGHT LEG  TRANSESOPHAGEAL ECHOCARDIOGRAM   SURGEON:  Surgeon(s) and Role:    * Alleen Borne, MD - Primary  PHYSICIAN ASSISTANT: Lowella Dandy PA-C  ANESTHESIA:   general  EBL:  Total I/O In: -  Out: 150 [Urine:150]  BLOOD ADMINISTERED: PRBC and  CELLSAVER  DRAINS: Mediastinal chest drains, left pleural chest tube   LOCAL MEDICATIONS USED:  NONE  SPECIMEN:  No Specimen  DISPOSITION OF SPECIMEN:  N/A  COUNTS:  YES  TOURNIQUET:  * No tourniquets in log *  DICTATION: .Dragon Dictation  PLAN OF CARE: Admit to inpatient   PATIENT DISPOSITION:  ICU - intubated and hemodynamically stable.   Delay start of Pharmacological VTE agent (>24hrs) due to surgical blood loss or risk of bleeding: yes

## 2012-09-24 NOTE — Progress Notes (Signed)
ABG drawn after 20 minutes CPAP/PS.  PH 7.27, pCO2 51.7.  Pt awake, alert, FC, able to communicate needs.  Other extubation parameters met.  MD Bartle made aware.  Order to give an amp of bicarb, proceed with extubation.  Will continue to monitor.

## 2012-09-24 NOTE — Progress Notes (Signed)
  Echocardiogram Echocardiogram Transesophageal has been performed.  Kahla Risdon 09/24/2012, 8:03 AM

## 2012-09-24 NOTE — Progress Notes (Signed)
Utilization review completed. Cherree Conerly, RN, BSN. 

## 2012-09-24 NOTE — Preoperative (Signed)
Beta Blockers   Reason not to administer Beta Blockers:Not Applicable 

## 2012-09-24 NOTE — Procedures (Signed)
Extubation Procedure Note  Patient Details:   Name: Echo Propp DOB: 01-21-1946 MRN: [id number]   Airway Documentation:  Airway 7.5 mm (Active)  Secured at (cm) 22 cm 09/24/2012 10:09 AM  Measured From Lips 09/24/2012 10:09 AM  Secured Location Right 09/24/2012 10:09 AM  Secured By Pink Tape 09/24/2012 10:09 AM  Site Condition Dry 09/24/2012 10:09 AM    Evaluation  O2 sats: stable throughout Complications: No apparent complications Patient did tolerate procedure well. Bilateral Breath Sounds: Clear;Diminished   Yes NIF -20 FVC 600 Positive cuff leak  Toula Moos 09/24/2012, 3:15 PM

## 2012-09-24 NOTE — Anesthesia Preprocedure Evaluation (Addendum)
Anesthesia Evaluation  Patient identified by MRN, date of birth, ID band Patient awake    Reviewed: Allergy & Precautions, H&P , NPO status , Patient's Chart, lab work & pertinent test results  Airway Mallampati: II TM Distance: >3 FB     Dental  (+) Teeth Intact and Dental Advisory Given   Pulmonary Current Smoker,  breath sounds clear to auscultation        Cardiovascular hypertension, Pt. on medications + Past MI + dysrhythmias Rhythm:Regular Rate:Normal  RCA opened in Cath Lab with balloon only; pt. with CP 5 out of 10   Neuro/Psych    GI/Hepatic Neg liver ROS, History of n/v last evening. CE   Endo/Other  negative endocrine ROS  Renal/GU      Musculoskeletal   Abdominal   Peds  Hematology   Anesthesia Other Findings   Reproductive/Obstetrics                         Anesthesia Physical Anesthesia Plan  ASA: III and emergent  Anesthesia Plan: General   Post-op Pain Management:    Induction: Intravenous, Rapid sequence and Cricoid pressure planned  Airway Management Planned: Oral ETT  Additional Equipment: Arterial line, CVP, PA Cath, 3D TEE and Ultrasound Guidance Line Placement  Intra-op Plan:   Post-operative Plan: Possible Post-op intubation/ventilation  Informed Consent:   Dental advisory given  Plan Discussed with: CRNA, Anesthesiologist and Surgeon  Anesthesia Plan Comments:        Anesthesia Quick Evaluation

## 2012-09-24 NOTE — Anesthesia Postprocedure Evaluation (Signed)
  Anesthesia Post-op Note  Patient: [name] Todd  Procedure(s) Performed: Procedure(s): CORONARY ARTERY BYPASS GRAFTING (CABG) (N/A) ENDOVEIN HARVEST OF GREATER SAPHENOUS VEIN (Right) TRANSESOPHAGEAL ECHOCARDIOGRAM (TEE) (N/A)  Patient Location: ICU  Anesthesia Type:General  Level of Consciousness: sedated  Airway and Oxygen Therapy: Patient remains intubated per anesthesia plan  Post-op Pain: none  Post-op Assessment: Post-op Vital signs reviewed, Patient's Cardiovascular Status Stable and Respiratory Function Stable  Post-op Vital Signs: Reviewed and stable  Complications: No apparent anesthesia complications

## 2012-09-24 NOTE — H&P (Signed)
    Pt was reexamined and existing H & P reviewed. No changes found.  Runell Gess, MD [facility] 09/24/2012 2:51 AM

## 2012-09-24 NOTE — ED Provider Notes (Signed)
CSN: [id number]     Arrival date & time 09/24/12  0221 History     None    Chief Complaint  Patient presents with  . Code STEMI   HPI [name] Todd is a 67 y.o. female presents as a code STEMI from home.  Patient had onset of substernal chest pain approximately 12:30, she says this feels "like the worst indigestion ever had" she's received 324 mg of aspirin, one sublingual much of a stroke, she's rates the pain is still 8/10, she says it radiates to her back, per EMS she was diaphoretic on arrival she has vomited multiple times she still nauseous.  She is a smoker, not diabetic, no hypertension or known hyperlipidemia.   Past Medical History  Diagnosis Date  . Dysmenorrhea   . Breast mass   . Estrogen deficiency   . Increased blood pressure (not hypertension)   . Endometriosis    Past Surgical History  Procedure Laterality Date  . Abdominal hysterectomy    . Back surgery     Family History  Problem Relation Age of Onset  . Diabetes Maternal Grandmother    History  Substance Use Topics  . Smoking status: Never Smoker   . Smokeless tobacco: Never Used  . Alcohol Use: No   OB History   Grav Para Term Preterm Abortions TAB SAB Ect Mult Living   0 0 0 0 0 0 0 0 0 0      Review of Systems At least 10pt or greater review of systems completed and are negative except where specified in the HPI.  Allergies  Review of patient's allergies indicates no known allergies.  Home Medications  No current outpatient prescriptions on file. BP 99/68  Pulse 94  Temp(Src) 97.4 F (36.3 C) (Oral)  Resp 16  SpO2 99% Physical Exam  Nursing notes reviewed.  Electronic medical record reviewed. VITAL SIGNS:   Filed Vitals:   09/25/12 0515 09/25/12 0530 09/25/12 0545 09/25/12 0600  BP: 90/55 89/58 91/61  92/56  Pulse: 80 86 80 83  Temp: 99.7 F (37.6 C) 99.5 F (37.5 C) 99.5 F (37.5 C) 99.3 F (37.4 C)  TempSrc:      Resp: 19 18 17 19   Height:      Weight:      SpO2: 100% 98% 99%  98%   CONSTITUTIONAL: Awake, oriented, appears uncomfortable HENT: Atraumatic, normocephalic, oral mucosa pink and moist, airway patent. Nares patent without drainage. External ears normal. EYES: Conjunctiva clear, EOMI, PERRLA NECK: Trachea midline, non-tender, supple CARDIOVASCULAR: Normal heart rate, Normal rhythm, No murmurs, rubs, gallops PULMONARY/CHEST: Clear to auscultation, no rhonchi, wheezes, or rales. Symmetrical breath sounds. Non-tender. ABDOMINAL: Non-distended, soft, non-tender - no rebound or guarding.  BS normal. NEUROLOGIC: Non-focal, moving all four extremities, no gross sensory or motor deficits. EXTREMITIES: No clubbing, cyanosis, or edema SKIN: Warm, Dry, No erythema, No rash  ED Course   Procedures (including critical care time)  Date: 09/24/2012  Rate: 90  Rhythm: normal sinus rhythm  QRS Axis: normal  Intervals: normal  ST/T Wave abnormalities: ST elevations in the inferior leads 23 and aVF, minimal elevation in V5, V6, reciprocal flipped T waves in 1 and aVL  Conduction Disutrbances: none  Narrative Interpretation: ST elevation MI  Labs Reviewed  CBC - Abnormal; Notable for the following:    RBC 3.65 (*)    Hemoglobin 11.9 (*)    HCT 33.8 (*)    All other components within normal limits  DIFFERENTIAL - Abnormal; Notable for  the following:    Neutrophils Relative % 27 (*)    Neutro Abs 1.4 (*)    Lymphocytes Relative 54 (*)    Eosinophils Relative 6 (*)    All other components within normal limits  PROTIME-INR  APTT  BASIC METABOLIC PANEL   Dg Chest Portable 1 View  09/24/2012   *RADIOLOGY REPORT*  Clinical Data: Postop CABG  PORTABLE CHEST - 1 VIEW  Comparison: 09/24/2012  Findings: Post open heart surgery.  Median sternotomy.  Endotracheal tube in good position.  Swan-Ganz catheter in the main pulmonary artery.  Mediastinal drain and pericardial drains in good position.  Bilateral chest tube in good position without pneumothorax.  NG tube in the  stomach.  Mild atelectasis in the lung bases.  Negative for heart failure, edema, or pleural effusion.  IMPRESSION: Satisfactory postop open heart surgery.   Original Report Authenticated By: Janeece Riggers, M.D.   Dg Chest Portable 1 View  09/24/2012   *RADIOLOGY REPORT*  Clinical Data: Code STEMI.  PORTABLE CHEST - 1 VIEW  Comparison: Plain film chest 03/08/2007.  Findings: Lungs are clear.  Heart size is normal.  No pneumothorax or pleural effusion.  IMPRESSION: Negative chest.   Original Report Authenticated By: Holley Dexter, M.D.   1. Chest pain   2. ST elevation myocardial infarction (STEMI) of inferior wall     MDM  [name] Todd is a 67 y.o. female presenting with acute ST elevation MI in the inferior distribution with some involvement of the lateral aspect as well, patient taken to cath lab by Dr. Allyson Sabal. Patient was evaluated Dr. Tresa Endo.    Patient received aspirin in route, patient was complaining about some pain radiating to the back with concern for possible dissection however chest x-ray was unremarkable and ST elevation inferiorly will sometimes present with radiating pain to the back. I do not think there is any danger in heparinizing so I have given the patient heparin.  Patient taken quickly to the cath lab for emergent PCI.   Jones Skene, MD 09/25/12 [phone]

## 2012-09-24 NOTE — Consult Note (Signed)
Cardiothoracic surgery Consultation:  Reason for Consult: Severe multivessel CAD with acute inferior STEMI Referring Physician: Dr. Nanetta Batty  [name] Todd is an 67 y.o. female.  HPI:   67 year old smoker with HTN who woke up between 11:30 -12:00 tonight with chest pain. She could not go back to sleep due to worsening pain and called EMS. ECG in field showed inferior STEMI. Her pain improved with ASA and 2 SL NTG, fentanyl. She received no Plavix or other anticoagulants. Cath shows occluded RCA with high grade proximal LAD and OM stenoses. Flow was reestablished down the RCA but it is severely and diffusely diseased throughout the main vessel.  PDA looks ok. She has continued to have back pain.  Past Medical History  Diagnosis Date  . Dysmenorrhea   . Breast mass   . Estrogen deficiency   . Increased blood pressure (not hypertension)   . Endometriosis   . Hypertension     Past Surgical History  Procedure Laterality Date  . Abdominal hysterectomy    . Back surgery      Family History  Problem Relation Age of Onset  . Diabetes Maternal Grandmother     Social History:  reports that she has been smoking Cigarettes.  She has been smoking about 0.00 packs per day. She has never used smokeless tobacco. She reports that she does not drink alcohol or use illicit drugs.  Allergies: No Known Allergies  Medications:  I have reviewed the patient's current medications. Prior to Admission:  Prescriptions prior to admission  Medication Sig Dispense Refill  . aliskiren (TEKTURNA) 150 MG tablet Take 150 mg by mouth daily.      Marland Kitchen amLODipine (NORVASC) 5 MG tablet Take 5 mg by mouth daily.      Marland Kitchen estradiol (CLIMARA - DOSED IN MG/24 HR) 0.1 mg/24hr Place 1 patch (0.1 mg total) onto the skin once a week.  4 patch  12  . estradiol (ESTRACE) 1 MG tablet Take 1 tablet (1 mg total) by mouth daily.  30 tablet  9  . estradiol (VIVELLE-DOT) 0.1 MG/24HR Place 1 patch (0.1 mg total) onto the skin 2  (two) times a week. 1 x weekly  PLEASE NOTE SIG CHANGE  8 patch  11  . lisinopril (PRINIVIL,ZESTRIL) 10 MG tablet Take 10 mg by mouth daily.      . NON FORMULARY BP med pt unsure of the name Pt will call back with information      . oxyCODONE-acetaminophen (ROXICET) 5-325 MG per tablet Take 1 tablet by mouth every 4 (four) hours as needed for pain.  12 tablet  0   Scheduled: . aminocaproic acid (AMICAR) for OHS   Intravenous To OR  . cefUROXime (ZINACEF)  IV  1.5 g Intravenous To OR  . cefUROXime (ZINACEF)  IV  750 mg Intravenous To OR  . dexmedetomidine  0.1-0.7 mcg/kg/hr Intravenous To OR  . DOPamine  2-20 mcg/kg/min Intravenous To OR  . epinephrine  0.5-20 mcg/min Intravenous To OR  . fentaNYL      . heparin-papaverine-plasmalyte irrigation   Irrigation To OR  . heparin 30,000 units/NS 1000 mL solution for CELLSAVER   Other To OR  . insulin (NOVOLIN-R) infusion   Intravenous To OR  . magnesium sulfate  40 mEq Other To OR  . nitroGLYCERIN  2-200 mcg/min Intravenous To OR  . phenylephrine (NEO-SYNEPHRINE) Adult infusion  30-200 mcg/min Intravenous To OR  . potassium chloride  80 mEq Other To OR  . vancomycin  1,250 mg Intravenous To OR   Continuous:  PRN: Anti-infectives   Start     Dose/Rate Route Frequency Ordered Stop   09/24/12 0400  vancomycin (VANCOCIN) 1,250 mg in sodium chloride 0.9 % 250 mL IVPB     1,250 mg 166.7 mL/hr over 90 Minutes Intravenous To Surgery 09/24/12 0351 09/25/12 0400   09/24/12 0400  cefUROXime (ZINACEF) 1.5 g in dextrose 5 % 50 mL IVPB     1.5 g 100 mL/hr over 30 Minutes Intravenous To Surgery 09/24/12 0351 09/25/12 0400   09/24/12 0400  cefUROXime (ZINACEF) 750 mg in dextrose 5 % 50 mL IVPB     750 mg 100 mL/hr over 30 Minutes Intravenous To Surgery 09/24/12 0350 09/25/12 0400      Results for orders placed during the hospital encounter of 09/24/12 (from the past 48 hour(s))  CBC     Status: Abnormal   Collection Time    09/24/12  2:32 AM       Result Value Range   WBC 5.3  4.0 - 10.5 K/uL   RBC 3.65 (*) 3.87 - 5.11 MIL/uL   Hemoglobin 11.9 (*) 12.0 - 15.0 g/dL   HCT 45.4 (*) 09.8 - 11.9 %   MCV 92.6  78.0 - 100.0 fL   MCH 32.6  26.0 - 34.0 pg   MCHC 35.2  30.0 - 36.0 g/dL   RDW 14.7  82.9 - 56.2 %   Platelets 222  150 - 400 K/uL  DIFFERENTIAL     Status: Abnormal   Collection Time    09/24/12  2:32 AM      Result Value Range   Neutrophils Relative % 27 (*) 43 - 77 %   Neutro Abs 1.4 (*) 1.7 - 7.7 K/uL   Lymphocytes Relative 54 (*) 12 - 46 %   Lymphs Abs 2.9  0.7 - 4.0 K/uL   Monocytes Relative 12  3 - 12 %   Monocytes Absolute 0.7  0.1 - 1.0 K/uL   Eosinophils Relative 6 (*) 0 - 5 %   Eosinophils Absolute 0.3  0.0 - 0.7 K/uL   Basophils Relative 1  0 - 1 %   Basophils Absolute 0.1  0.0 - 0.1 K/uL  PROTIME-INR     Status: None   Collection Time    09/24/12  2:32 AM      Result Value Range   Prothrombin Time 12.3  11.6 - 15.2 seconds   INR 0.93  0.00 - 1.49  APTT     Status: None   Collection Time    09/24/12  2:32 AM      Result Value Range   aPTT 29  24 - 37 seconds  BASIC METABOLIC PANEL     Status: Abnormal   Collection Time    09/24/12  2:32 AM      Result Value Range   Sodium 138  135 - 145 mEq/L   Potassium 3.5  3.5 - 5.1 mEq/L   Chloride 98  96 - 112 mEq/L   CO2 29  19 - 32 mEq/L   Glucose, Bld 135 (*) 70 - 99 mg/dL   BUN 25 (*) 6 - 23 mg/dL   Creatinine, Ser 1.30 (*) 0.50 - 1.10 mg/dL   Calcium 9.4  8.4 - 86.5 mg/dL   GFR calc non Af Amer 42 (*) >90 mL/min   GFR calc Af Amer 49 (*) >90 mL/min   Comment:  The eGFR has been calculated     using the CKD EPI equation.     This calculation has not been     validated in all clinical     situations.     eGFR's persistently     <90 mL/min signify     possible Chronic Kidney Disease.    Dg Chest Portable 1 View  09/24/2012   *RADIOLOGY REPORT*  Clinical Data: Code STEMI.  PORTABLE CHEST - 1 VIEW  Comparison: Plain film chest 03/08/2007.   Findings: Lungs are clear.  Heart size is normal.  No pneumothorax or pleural effusion.  IMPRESSION: Negative chest.   Original Report Authenticated By: Holley Dexter, M.D.    Review of Systems  Unable to perform ROS: medical condition   Blood pressure 99/68, pulse 94, temperature 97.4 F (36.3 C), temperature source Oral, resp. rate 16, height 5' 1.81" (1.57 m), weight 130 lb 1.1 oz (59 kg), SpO2 99.00%. Physical Exam  Constitutional:  Looks uncomfortable on the cath table. Covered with drapes, sheath and catheter in groin, wire down RCA.  Cardiovascular: Normal rate, regular rhythm and normal heart sounds.   No murmur heard. Respiratory: Effort normal and breath sounds normal.    Assessment/Plan:  Severe multivessel coronary disease with occluded, diffusely diseased RCA, high grade proximal LAD and OM. Some flow reestablished down RCA but it is severely and diffusely diseased. LAD not amenable to PCI. I agree that emergent CABG is the best treatment. I discussed the procedure with the patient and she is agreeable to proceed. She does not have any family with her and does want her family notified.  Shaneca Orne K 09/24/2012, 4:02 AM

## 2012-09-24 NOTE — Transfer of Care (Signed)
Immediate Anesthesia Transfer of Care Note  Patient: [name] Todd  Procedure(s) Performed: Procedure(s): CORONARY ARTERY BYPASS GRAFTING (CABG) (N/A) ENDOVEIN HARVEST OF GREATER SAPHENOUS VEIN (Right) TRANSESOPHAGEAL ECHOCARDIOGRAM (TEE) (N/A)  Patient Location: SICU  Anesthesia Type:General  Level of Consciousness: sedated, unresponsive and Patient remains intubated per anesthesia plan  Airway & Oxygen Therapy: Patient remains intubated per anesthesia plan and Patient placed on Ventilator (see vital sign flow sheet for setting)  Post-op Assessment: Report given to PACU RN and Post -op Vital signs reviewed and stable  Post vital signs: Reviewed and stable  Complications: No apparent anesthesia complications

## 2012-09-25 ENCOUNTER — Encounter (HOSPITAL_COMMUNITY): Payer: Self-pay | Admitting: Surgery

## 2012-09-25 ENCOUNTER — Inpatient Hospital Stay (HOSPITAL_COMMUNITY): Payer: Medicare Other

## 2012-09-25 DIAGNOSIS — I2119 ST elevation (STEMI) myocardial infarction involving other coronary artery of inferior wall: Secondary | ICD-10-CM

## 2012-09-25 DIAGNOSIS — I10 Essential (primary) hypertension: Secondary | ICD-10-CM

## 2012-09-25 DIAGNOSIS — I4891 Unspecified atrial fibrillation: Secondary | ICD-10-CM

## 2012-09-25 DIAGNOSIS — F17200 Nicotine dependence, unspecified, uncomplicated: Secondary | ICD-10-CM

## 2012-09-25 DIAGNOSIS — R079 Chest pain, unspecified: Secondary | ICD-10-CM

## 2012-09-25 LAB — CBC
Hemoglobin: 11 g/dL — ABNORMAL LOW (ref 12.0–15.0)
Hemoglobin: 11 g/dL — ABNORMAL LOW (ref 12.0–15.0)
MCH: 31.6 pg (ref 26.0–34.0)
MCHC: 35 g/dL (ref 30.0–36.0)
RBC: 3.42 MIL/uL — ABNORMAL LOW (ref 3.87–5.11)
RBC: 3.48 MIL/uL — ABNORMAL LOW (ref 3.87–5.11)

## 2012-09-25 LAB — BASIC METABOLIC PANEL
GFR calc non Af Amer: 69 mL/min — ABNORMAL LOW (ref >90)
Glucose, Bld: 123 mg/dL — ABNORMAL HIGH (ref 70–99)
Potassium: 4.3 mEq/L (ref 3.5–5.1)
Sodium: 134 mEq/L — ABNORMAL LOW (ref 135–145)

## 2012-09-25 LAB — MAGNESIUM: Magnesium: 3 mg/dL — ABNORMAL HIGH (ref 1.5–2.5)

## 2012-09-25 LAB — CREATININE, SERUM
Creatinine, Ser: 0.93 mg/dL (ref 0.50–1.10)
GFR calc Af Amer: 73 mL/min — ABNORMAL LOW (ref >90)
GFR calc non Af Amer: 63 mL/min — ABNORMAL LOW (ref >90)

## 2012-09-25 LAB — GLUCOSE, CAPILLARY
Glucose-Capillary: 79 mg/dL (ref 70–99)
Glucose-Capillary: 75 mg/dL (ref 70–99)
Glucose-Capillary: 111 mg/dL — ABNORMAL HIGH (ref 70–99)
Glucose-Capillary: 138 mg/dL — ABNORMAL HIGH (ref 70–99)
Glucose-Capillary: 117 mg/dL — ABNORMAL HIGH (ref 70–99)

## 2012-09-25 LAB — POCT I-STAT, CHEM 8
BUN: 13 mg/dL (ref 6–23)
Calcium, Ion: 1.14 mmol/L (ref 1.13–1.30)
Creatinine, Ser: 1 mg/dL (ref 0.50–1.10)
Glucose, Bld: 115 mg/dL — ABNORMAL HIGH (ref 70–99)
Hemoglobin: 11.2 g/dL — ABNORMAL LOW (ref 12.0–15.0)
TCO2: 22 mmol/L (ref 0–100)

## 2012-09-25 LAB — HEMOGLOBIN A1C: Hgb A1c MFr Bld: 5.7 % — ABNORMAL HIGH (ref <5.7)

## 2012-09-25 MED ORDER — TRAMADOL HCL 50 MG PO TABS
50.0000 mg | ORAL_TABLET | Freq: Four times a day (QID) | ORAL | Status: DC | PRN
  Administered 2012-09-25 – 2012-09-28 (×7): 50 mg via ORAL
  Filled 2012-09-25 (×8): qty 1

## 2012-09-25 MED ORDER — INSULIN ASPART 100 UNIT/ML ~~LOC~~ SOLN: 0.0000 [IU] | SUBCUTANEOUS | Status: DC

## 2012-09-25 MED FILL — Potassium Chloride Inj 2 mEq/ML: INTRAVENOUS | Qty: 40 | Status: AC

## 2012-09-25 MED FILL — Sodium Chloride IV Soln 0.9%: INTRAVENOUS | Qty: 50 | Status: AC

## 2012-09-25 MED FILL — Lidocaine HCl IV Inj 20 MG/ML: INTRAVENOUS | Qty: 5 | Status: AC

## 2012-09-25 MED FILL — Electrolyte-R (PH 7.4) Solution: INTRAVENOUS | Qty: 1000 | Status: AC

## 2012-09-25 MED FILL — Sodium Chloride IV Soln 0.9%: INTRAVENOUS | Qty: 1000 | Status: AC

## 2012-09-25 MED FILL — Mannitol IV Soln 20%: INTRAVENOUS | Qty: 500 | Status: AC

## 2012-09-25 MED FILL — Heparin Sodium (Porcine) Inj 1000 Unit/ML: INTRAMUSCULAR | Qty: 30 | Status: AC

## 2012-09-25 MED FILL — Magnesium Sulfate Inj 50%: INTRAMUSCULAR | Qty: 10 | Status: AC

## 2012-09-25 MED FILL — Sodium Bicarbonate IV Soln 8.4%: INTRAVENOUS | Qty: 50 | Status: AC

## 2012-09-25 MED FILL — Heparin Sodium (Porcine) Inj 1000 Unit/ML: INTRAMUSCULAR | Qty: 10 | Status: AC

## 2012-09-25 MED FILL — Sodium Chloride Irrigation Soln 0.9%: Qty: 3000 | Status: AC

## 2012-09-25 NOTE — Care Management Note (Unsigned)
    Page 1 of 2   09/28/2012     3:57:05 PM   CARE MANAGEMENT NOTE 09/28/2012  Patient:  [name] Todd, [name] Todd   Account Number:  [id number]  Date Initiated:  09/25/2012  Documentation initiated by:  Avie Arenas  Subjective/Objective Assessment:   STEMI - emergent Cath and CABG x4.     Action/Plan:   Anticipated DC Date:  09/30/2012   Anticipated DC Plan:  HOME W HOME HEALTH SERVICES      DC Planning Services  CM consult      [place] Choice  HOME HEALTH   Choice offered to / List presented to:  C-1 Patient        HH arranged  HH-1 RN  HH-6 SOCIAL WORKER      HH agency  [facility]   Status of service:  Completed, signed off Medicare Important Message given?   (If response is "NO", the following Medicare IM given date fields will be blank) Date Medicare IM given:   Date Additional Medicare IM given:    Discharge Disposition:  HOME W HOME HEALTH SERVICES  Per UR Regulation:  Reviewed for med. necessity/level of care/duration of stay  If discussed at Long Length of Stay Meetings, dates discussed:    Comments:  Contact:  Hines,Pamela Other [phone]  09/28/12 Bertrum Helmstetter,RN,BSN [phone] PT STATES SHE IS AGREEABLE TO HOME HEALTH FOLLOW UP AT DC. REFERRAL TO [facility], PER PT CHOICE.  START OF CARE 24-48H POST DC DATE.  SISTER ARRIVED TO DISCHARGE PT:  SHE STATES THAT THEIR HOME IS BEING FORECLOSED ON, BUT DOES NOT KNOW WHEN THIS WILL HAPPEN.  SISTER PLANS TO GO TO BROTHER'S HOME AT THAT TIME.  PT AND SISTER BICKERING AND ARGUING DURING CARDIAC REHAB'S TEACHING.  IT APPEARS THAT THEY HAVE A STRAINED RELATIONSHIP, AT BEST.  SISTER DOES CONTRACT TO STAY WITH PT AS LONG AS THEY ARE ABLE TO STAY IN THE HOME TOGETHER.  IN THE MEANTIME, [facility] CSW WILL BE OUT TO MEET WITH PT TO DISCUSS ALTERNATE HOUSING OPTIONS WITH PT.  HHRN TO SEE PT ON SUNDAY, 8/10.   09/27/12 Cowan Pilar,RN,BSN [phone] MET WITH PT TO DISCUSS DC PLANS.  PT STATES SHE WILL DC HOME WITH SISTER, WHO LIVES WITH HER.   SHE STATES SISTER DOES NOT WORK, AND WILL BE WITH HER 24/7.  PT CURRENTLY AMBULATING WELL AND FOLLOWING STERNAL PRECAUTIONS.  SHE DOES NOT REQUIRE ASSISTIVE DEVICE.  ATTEMPTED TO CALL SISTER "PAM", BUT GOT NO ANSWER.  09-26-12 11:20 am Avie Arenas, RNBSN - [phone] Nurse states house she lives in is to be forclosed on in one month per her brother and her sister is not able to care for her on discharge. Patient was somewhat confused last night.  Repeats asking can I go home to whoever enters room.  Tx to 2000 today.  SW consult placed. Placed PT/OTorders.  09-25-12 3pm Avie Arenas, RNBSN [phone] Patient sitting up in chair.  States lives with sister and here SO.  They are with her 24/7 on discharge.  Patient still has oxygen - independent prior to admisson - states weak but  ambulated ok.   CM will continue to follow for further needs.

## 2012-09-25 NOTE — Progress Notes (Addendum)
      [address]       [place] [address]             [phone]      1 Day Post-Op Procedure(s) (LRB): CORONARY ARTERY BYPASS GRAFTING (CABG) (N/A) ENDOVEIN HARVEST OF GREATER SAPHENOUS VEIN (Right) TRANSESOPHAGEAL ECHOCARDIOGRAM (TEE) (N/A)  Subjective:  Ms. Shader states she feels okay this morning.  She does have some pain which pain medication helps.   Objective: Vital signs in last 24 hours: Temp:  [94.8 F (34.9 C)-99.7 F (37.6 C)] 99.1 F (37.3 C) (08/05 0700) Pulse Rate:  [76-94] 80 (08/05 0700) Cardiac Rhythm:  [-] Normal sinus rhythm;Bundle branch block (08/05 0700) Resp:  [11-26] 25 (08/05 0700) BP: (79-103)/(50-77) 84/57 mmHg (08/05 0645) SpO2:  [94 %-100 %] 97 % (08/05 0700) Arterial Line BP: (73-116)/(50-72) 84/53 mmHg (08/05 0700) FiO2 (%):  [40 %-50 %] 40 % (08/04 1351) Weight:  [149 lb 7.6 oz (67.8 kg)] 149 lb 7.6 oz (67.8 kg) (08/05 0600)  Hemodynamic parameters for last 24 hours: PAP: (20-49)/(11-33) 37/20 mmHg CO:  [2.7 L/min-3.6 L/min] 3.4 L/min CI:  [1.7 L/min/m2-2.7 L/min/m2] 2.2 L/min/m2  Intake/Output from previous day: 08/04 0701 - 08/05 0700 In: 4839.3 [P.O.:220; I.V.:2784.3; Blood:285; IV Piggyback:1550] Out: 4573 [Urine:2290; Emesis/NG output:100; Blood:1450; Chest Tube:733] : General appearance: alert, cooperative and no distress Neurologic: intact Heart: regular rate and rhythm Lungs: diminished breath sounds bibasilar Abdomen: soft, non-tender; bowel sounds normal; no masses,  no organomegaly Extremities: edema trace Wound: clean and dry  Lab Results:  Recent Labs  09/24/12 1615 09/24/12 1651 09/25/12 0305  WBC 12.5*  --  10.6*  HGB 12.0 11.6* 11.0*  HCT 34.3* 34.0* 31.4*  PLT 132*  --  121*   BMET:  Recent Labs  09/24/12 0232  09/24/12 1651 09/25/12 0305  NA 138  < > 141 134*  K 3.5  < > 4.4 4.3  CL 98  < > 106 104  CO2 29  --   --  24  GLUCOSE 135*  < > 127* 123*  BUN 25*  < > 15 13    CREATININE 1.29*  < > 1.00 0.86  CALCIUM 9.4  --   --  8.0*  < > = values in this interval not displayed.  PT/INR:  Recent Labs  09/24/12 0232  LABPROT 12.3  INR 0.93   ABG    Component Value Date/Time   PHART 7.320* 09/24/2012 1645   HCO3 26.7* 09/24/2012 1645   TCO2 24 09/24/2012 1651   ACIDBASEDEF 4.0* 09/24/2012 1452   O2SAT 89.0 09/24/2012 1645   CBG (last 3)   Recent Labs  09/24/12 1923 09/24/12 2311 09/25/12 0306  GLUCAP 138* 119* 117*   CXR: bibasilar atelectasis  ECG: NSR: RBBB Assessment/Plan: S/P Procedure(s) (LRB): CORONARY ARTERY BYPASS GRAFTING (CABG) (N/A) ENDOVEIN HARVEST OF GREATER SAPHENOUS VEIN (Right) TRANSESOPHAGEAL ECHOCARDIOGRAM (TEE) (N/A)  1. CV- NSR, hypotensive, remains on Neo will wean as tolerated 2. Pulm- wean oxygen as tolerated, CXR stable for post op, + atelectasis encourage use of IS 3. Renal- creatinine stable, volume overloaded, will diuresis when appropriate 4. Expected Blood Loss Anemia- mild, Hgb 11.0 5. CBGs controlled, not a diabetic will wean insulin as tolerated 6. Dispo- patient stable, wean Neo, D/C chest tubes   LOS: 1 day    BARRETT, ERIN 09/25/2012   Chart reviewed, patient examined, agree with above.

## 2012-09-25 NOTE — CV Procedure (Signed)
[name] Todd is a 67 y.o. female    [id number] LOCATION:  FACILITY: [facility]  PHYSICIAN: Nanetta Batty, M.D. 04-24-1945   DATE OF PROCEDURE:  09/25/2012  DATE OF DISCHARGE:   CARDIAC CATHETERIZATION     History obtained from chart review.67 yo woman with PMH of hypertension and tobacco use who woke up between 11:30 and 12:00 with some chest pain. She tried to go back to sleep but ultimately called EMS. She had an ECG in the field revealing inferior ST elevation with lateral ST depression leading EMS to call STEMI Code. On arrival to ER, [name] Todd had received 324 mg aspirin, 2 SL NTG that took the edge off her CP from 9/10 to 7/10. She characterized the pain as bad indigestion, substernal, some dull back pain but no radiation to back. She had some associated shortness of breath, nausea/vomiting. She received 50 mcg fentanyl that helped her pain down to 4/10. She had almost the exact bp in both arms 98-101/58 in both arms and chest x-ray with stable mediastinum so she received 4000 units of heparin in the ER. STEMI team arrived and she went to the Cath lab.     PROCEDURE DESCRIPTION:    The patient was brought to the second floor [facility] cath lab in the postabsorptive state. She was  premedicated with Valium 5 mg by mouth, IV Versed and fentanyl. Her right groinwas prepped and shaved in usual sterile fashion. Xylocaine 1% was used for local anesthesia. A 6 French sheath was inserted into the right common femoral artery using standard Seldinger technique.6 French right and left Judkins diagnostic catheters were used for selective coronary angiography. Left ventriculography was was not performed. Visipaque was used for the entirety of the case. Retrograde aortic pressure is monitored during the case.   HEMODYNAMICS:    AO SYSTOLIC/AO DIASTOLIC: see cath lab flow   ANGIOGRAPHIC RESULTS:   1. Left main; normal  2. LAD; 95% proximal tandem, 40% mid and distal 3. Left circumflex;  90% OM1 proximal.  4. Right coronary artery; comment 75% ostial and total occlusion just after the first bend with 90% blockage at the crux 5. Left ventriculography;not performed  IMPRESSION:[name] Todd has an inferior ST segment elevation microinfarction with an occluded large dominant right and high-grade proximal LAD and circumflex disease. Her right is treatable percutaneously though her LAD probably would require coronary bypass grafting. In going to proceed with establishment of antegrade flow in her infarct related artery and then asked Dr. Rexanne Mano to take her to the emergency coronary bypass grafting.  Procedure description: The patient received aspirin but did not receive NO therapy. She received intermixed bolus with an ACT of 401. Total contrast administered the patient was 95 cc. Door to balloon time was 41 minutes.  Using a 6 [place] JR 4 guide catheter along with a 1/190 cm length per water guidewire a 3.5 mm x 15 dominant long angioplasty was performed on the ostium and proximal RCA reestablishing antegrade flow. I also used a priority one aspiration thrombectomy catheter. The patient's RCA was large and aneurysmal making stenting problematic. Once I establish antegrade flow her chest pain resolved and her EKG normalized. I left the guidewire down the vessel and secured everything in place. The patient left the Cath Lab pain free, hemodynamically stable to the operating room where she will get coronary artery bypass grafting off her LAD, diagonal branch, marginal branch and PDA.  Final impression: successful POBA in the setting of f an occluded  dominant RCA in the setting of an ST EMI. Antegrade flow was reestablished with angioplasty and aspiration thrombectomy interrupting the heart attack process.  [name] Gess MD, [facility] 09/25/2012 2:41 PM

## 2012-09-25 NOTE — Progress Notes (Signed)
Subjective:  C/O mild back pain, no CP/SOB  Objective:  Temp:  [94.8 F (34.9 C)-99.7 F (37.6 C)] 99.1 F (37.3 C) (08/05 0800) Pulse Rate:  [76-94] 84 (08/05 0800) Resp:  [11-26] 23 (08/05 0800) BP: (79-103)/(50-77) 95/57 mmHg (08/05 0800) SpO2:  [94 %-100 %] 94 % (08/05 0800) Arterial Line BP: (73-116)/(50-72) 109/62 mmHg (08/05 0800) FiO2 (%):  [40 %-50 %] 40 % (08/04 1351) Weight:  [149 lb 7.6 oz (67.8 kg)] 149 lb 7.6 oz (67.8 kg) (08/05 0600) Weight change: 19 lb 6.4 oz (8.8 kg)  Intake/Output from previous day: 08/04 0701 - 08/05 0700 In: 4839.3 [P.O.:220; I.V.:2784.3; Blood:285; IV Piggyback:1550] Out: 4573 [Urine:2290; Emesis/NG output:100; Blood:1450; Chest Tube:733]  Intake/Output from this shift: Total I/O In: -  Out: 130 [Urine:50; Chest Tube:80]  Physical Exam: General appearance: alert and no distress Neck: no adenopathy, no carotid bruit, no JVD, supple, symmetrical, trachea midline and thyroid not enlarged, symmetric, no tenderness/mass/nodules Lungs: clear to auscultation bilaterally Heart: regular rate and rhythm, S1, S2 normal, no murmur, click, rub or gallop Extremities: extremities normal, atraumatic, no cyanosis or edema  Lab Results: Results for orders placed during the hospital encounter of 09/24/12 (from the past 48 hour(s))  CBC     Status: Abnormal   Collection Time    09/24/12  2:32 AM      Result Value Range   WBC 5.3  4.0 - 10.5 K/uL   RBC 3.65 (*) 3.87 - 5.11 MIL/uL   Hemoglobin 11.9 (*) 12.0 - 15.0 g/dL   HCT 16.1 (*) 09.6 - 04.5 %   MCV 92.6  78.0 - 100.0 fL   MCH 32.6  26.0 - 34.0 pg   MCHC 35.2  30.0 - 36.0 g/dL   RDW 40.9  81.1 - 91.4 %   Platelets 222  150 - 400 K/uL  DIFFERENTIAL     Status: Abnormal   Collection Time    09/24/12  2:32 AM      Result Value Range   Neutrophils Relative % 27 (*) 43 - 77 %   Neutro Abs 1.4 (*) 1.7 - 7.7 K/uL   Lymphocytes Relative 54 (*) 12 - 46 %   Lymphs Abs 2.9  0.7 - 4.0 K/uL   Monocytes Relative 12  3 - 12 %   Monocytes Absolute 0.7  0.1 - 1.0 K/uL   Eosinophils Relative 6 (*) 0 - 5 %   Eosinophils Absolute 0.3  0.0 - 0.7 K/uL   Basophils Relative 1  0 - 1 %   Basophils Absolute 0.1  0.0 - 0.1 K/uL  PROTIME-INR     Status: None   Collection Time    09/24/12  2:32 AM      Result Value Range   Prothrombin Time 12.3  11.6 - 15.2 seconds   INR 0.93  0.00 - 1.49  APTT     Status: None   Collection Time    09/24/12  2:32 AM      Result Value Range   aPTT 29  24 - 37 seconds  BASIC METABOLIC PANEL     Status: Abnormal   Collection Time    09/24/12  2:32 AM      Result Value Range   Sodium 138  135 - 145 mEq/L   Potassium 3.5  3.5 - 5.1 mEq/L   Chloride 98  96 - 112 mEq/L   CO2 29  19 - 32 mEq/L   Glucose, Bld 135 (*) 70 -  99 mg/dL   BUN 25 (*) 6 - 23 mg/dL   Creatinine, Ser 1.61 (*) 0.50 - 1.10 mg/dL   Calcium 9.4  8.4 - 09.6 mg/dL   GFR calc non Af Amer 42 (*) >90 mL/min   GFR calc Af Amer 49 (*) >90 mL/min   Comment:            The eGFR has been calculated     using the CKD EPI equation.     This calculation has not been     validated in all clinical     situations.     eGFR's persistently     <90 mL/min signify     possible Chronic Kidney Disease.  POCT I-STAT, CHEM 8     Status: Abnormal   Collection Time    09/24/12  3:17 AM      Result Value Range   Sodium 139  135 - 145 mEq/L   Potassium 3.7  3.5 - 5.1 mEq/L   Chloride 100  96 - 112 mEq/L   BUN 24 (*) 6 - 23 mg/dL   Creatinine, Ser 0.45 (*) 0.50 - 1.10 mg/dL   Glucose, Bld 409 (*) 70 - 99 mg/dL   Calcium, Ion 8.11  9.14 - 1.30 mmol/L   TCO2 26  0 - 100 mmol/L   Hemoglobin 11.2 (*) 12.0 - 15.0 g/dL   HCT 78.2 (*) 95.6 - 21.3 %  POCT ACTIVATED CLOTTING TIME     Status: None   Collection Time    09/24/12  3:20 AM      Result Value Range   Activated Clotting Time 406    TYPE AND SCREEN     Status: None   Collection Time    09/24/12  3:56 AM      Result Value Range   ABO/RH(D) O  POS     Antibody Screen NEG     Sample Expiration 09/27/2012     Unit Number [id number]     Blood Component Type RED CELLS,LR     Unit division 00     Status of Unit ISSUED,FINAL     Transfusion Status OK TO TRANSFUSE     Crossmatch Result Compatible     Unit Number [id number]     Blood Component Type RED CELLS,LR     Unit division 00     Status of Unit ISSUED,FINAL     Transfusion Status OK TO TRANSFUSE     Crossmatch Result Compatible     Unit Number [id number]     Blood Component Type RED CELLS,LR     Unit division 00     Status of Unit ALLOCATED     Transfusion Status OK TO TRANSFUSE     Crossmatch Result Compatible     Unit Number [id number]     Blood Component Type RED CELLS,LR     Unit division 00     Status of Unit ALLOCATED     Transfusion Status OK TO TRANSFUSE     Crossmatch Result Compatible    PLATELET COUNT     Status: Abnormal   Collection Time    09/24/12  8:17 AM      Result Value Range   Platelets 111 (*) 150 - 400 K/uL   Comment: RESULT CALLED TO, READ BACK BY AND VERIFIED WITH:     HEGARTY,A RN @ 0832 ON 09/24/12 BY LEONARD,A     PLATELET COUNT CONFIRMED BY SMEAR  HEMOGLOBIN AND HEMATOCRIT, BLOOD  Status: Abnormal   Collection Time    09/24/12  8:17 AM      Result Value Range   Hemoglobin 9.8 (*) 12.0 - 15.0 g/dL   Comment: RESULT CALLED TO, READ BACK BY AND VERIFIED WITH:     HEGARTY,A RN @ 0832 ON 09/24/12 BY LEONARD,A   HCT 27.9 (*) 36.0 - 46.0 %   Comment: RESULT CALLED TO, READ BACK BY AND VERIFIED WITH:     HEGARTY,A RN @ [phone] ON 09/24/12 BY LEONARD,A  CBC     Status: Abnormal   Collection Time    09/24/12 10:15 AM      Result Value Range   WBC 9.7  4.0 - 10.5 K/uL   RBC 3.73 (*) 3.87 - 5.11 MIL/uL   Hemoglobin 11.9 (*) 12.0 - 15.0 g/dL   Comment: POST TRANSFUSION SPECIMEN     HEART   HCT 33.6 (*) 36.0 - 46.0 %   MCV 90.1  78.0 - 100.0 fL   MCH 31.9  26.0 - 34.0 pg   MCHC 35.4  30.0 - 36.0 g/dL   RDW 11.9  14.7 - 82.9 %    Platelets 123 (*) 150 - 400 K/uL  HEMOGLOBIN A1C     Status: Abnormal   Collection Time    09/24/12 10:15 AM      Result Value Range   Hemoglobin A1C 5.7 (*) <5.7 %   Comment: (NOTE)                                                                               According to the ADA Clinical Practice Recommendations for 2011, when     HbA1c is used as a screening test:      >=6.5%   Diagnostic of Diabetes Mellitus               (if abnormal result is confirmed)     5.7-6.4%   Increased risk of developing Diabetes Mellitus     References:Diagnosis and Classification of Diabetes Mellitus,Diabetes     Care,2011,34(Suppl 1):S62-S69 and Standards of Medical Care in             Diabetes - 2011,Diabetes Care,2011,34 (Suppl 1):S11-S61.   Mean Plasma Glucose 117 (*) <117 mg/dL   Comment: Performed at [facility]  POCT I-STAT 3, BLOOD GAS (G3+)     Status: Abnormal   Collection Time    09/24/12 10:22 AM      Result Value Range   pH, Arterial 7.364  7.350 - 7.450   pCO2 arterial 40.6  35.0 - 45.0 mmHg   pO2, Arterial 143.0 (*) 80.0 - 100.0 mmHg   Bicarbonate 23.7  20.0 - 24.0 mEq/L   TCO2 25  0 - 100 mmol/L   O2 Saturation 99.0     Acid-base deficit 2.0  0.0 - 2.0 mmol/L   Patient temperature 34.8 C     Collection site ARTERIAL LINE     Drawn by Operator     Sample type ARTERIAL    POCT I-STAT 4, (NA,K, GLUC, HGB,HCT)     Status: Abnormal   Collection Time    09/24/12 10:24 AM      Result Value  Range   Sodium 136  135 - 145 mEq/L   Potassium 3.6  3.5 - 5.1 mEq/L   Glucose, Bld 103 (*) 70 - 99 mg/dL   HCT 16.1 (*) 09.6 - 04.5 %   Hemoglobin 10.9 (*) 12.0 - 15.0 g/dL  MRSA PCR SCREENING     Status: None   Collection Time    09/24/12 10:31 AM      Result Value Range   MRSA by PCR NEGATIVE  NEGATIVE   Comment:            The GeneXpert MRSA Assay (FDA     approved for NASAL specimens     only), is one component of a     comprehensive MRSA colonization     surveillance  program. It is not     intended to diagnose MRSA     infection nor to guide or     monitor treatment for     MRSA infections.  GLUCOSE, CAPILLARY     Status: None   Collection Time    09/24/12 11:26 AM      Result Value Range   Glucose-Capillary 79  70 - 99 mg/dL  LIPID PANEL     Status: Abnormal   Collection Time    09/24/12 12:00 PM      Result Value Range   Cholesterol 105  0 - 200 mg/dL   Triglycerides 50  <409 mg/dL   HDL 37 (*) >81 mg/dL   Total CHOL/HDL Ratio 2.8     VLDL 10  0 - 40 mg/dL   LDL Cholesterol 58  0 - 99 mg/dL   Comment:            Total Cholesterol/HDL:CHD Risk     Coronary Heart Disease Risk Table                         Men   Women      1/2 Average Risk   3.4   3.3      Average Risk       5.0   4.4      2 X Average Risk   9.6   7.1      3 X Average Risk  23.4   11.0                Use the calculated Patient Ratio     above and the CHD Risk Table     to determine the patient's CHD Risk.                ATP III CLASSIFICATION (LDL):      <100     mg/dL   Optimal      191-478  mg/dL   Near or Above                        Optimal      130-159  mg/dL   Borderline      295-621  mg/dL   High      >308     mg/dL   Very High  APTT     Status: Abnormal   Collection Time    09/24/12 12:00 PM      Result Value Range   aPTT 43 (*) 24 - 37 seconds   Comment:            IF BASELINE aPTT IS ELEVATED,     SUGGEST  PATIENT RISK ASSESSMENT     BE USED TO DETERMINE APPROPRIATE     ANTICOAGULANT THERAPY.  GLUCOSE, CAPILLARY     Status: None   Collection Time    09/24/12 12:07 PM      Result Value Range   Glucose-Capillary 75  70 - 99 mg/dL  TSH     Status: Abnormal   Collection Time    09/24/12 12:23 PM      Result Value Range   TSH 4.804 (*) 0.350 - 4.500 uIU/mL   Comment: Performed at [facility]  GLUCOSE, CAPILLARY     Status: None   Collection Time    09/24/12  1:11 PM      Result Value Range   Glucose-Capillary 73  70 - 99 mg/dL    GLUCOSE, CAPILLARY     Status: Abnormal   Collection Time    09/24/12  2:06 PM      Result Value Range   Glucose-Capillary 111 (*) 70 - 99 mg/dL  POCT I-STAT 3, BLOOD GAS (G3+)     Status: Abnormal   Collection Time    09/24/12  2:52 PM      Result Value Range   pH, Arterial 7.271 (*) 7.350 - 7.450   pCO2 arterial 51.7 (*) 35.0 - 45.0 mmHg   pO2, Arterial 91.0  80.0 - 100.0 mmHg   Bicarbonate 23.8  20.0 - 24.0 mEq/L   TCO2 25  0 - 100 mmol/L   O2 Saturation 95.0     Acid-base deficit 4.0 (*) 0.0 - 2.0 mmol/L   Patient temperature 37.1 C     Sample type ARTERIAL    CBC     Status: Abnormal   Collection Time    09/24/12  4:15 PM      Result Value Range   WBC 12.5 (*) 4.0 - 10.5 K/uL   RBC 3.75 (*) 3.87 - 5.11 MIL/uL   Hemoglobin 12.0  12.0 - 15.0 g/dL   HCT 40.9 (*) 81.1 - 91.4 %   MCV 91.5  78.0 - 100.0 fL   MCH 32.0  26.0 - 34.0 pg   MCHC 35.0  30.0 - 36.0 g/dL   RDW 78.2  95.6 - 21.3 %   Platelets 132 (*) 150 - 400 K/uL  MAGNESIUM     Status: Abnormal   Collection Time    09/24/12  4:15 PM      Result Value Range   Magnesium 3.6 (*) 1.5 - 2.5 mg/dL  CREATININE, SERUM     Status: Abnormal   Collection Time    09/24/12  4:15 PM      Result Value Range   Creatinine, Ser 0.91  0.50 - 1.10 mg/dL   GFR calc non Af Amer 65 (*) >90 mL/min   GFR calc Af Amer 75 (*) >90 mL/min   Comment:            The eGFR has been calculated     using the CKD EPI equation.     This calculation has not been     validated in all clinical     situations.     eGFR's persistently     <90 mL/min signify     possible Chronic Kidney Disease.  POCT I-STAT 3, BLOOD GAS (G3+)     Status: Abnormal   Collection Time    09/24/12  4:45 PM      Result Value Range   pH, Arterial 7.320 (*) 7.350 - 7.450  pCO2 arterial 51.9 (*) 35.0 - 45.0 mmHg   pO2, Arterial 63.0 (*) 80.0 - 100.0 mmHg   Bicarbonate 26.7 (*) 20.0 - 24.0 mEq/L   TCO2 28  0 - 100 mmol/L   O2 Saturation 89.0     Patient  temperature 37.0 C     Sample type ARTERIAL    POCT I-STAT, CHEM 8     Status: Abnormal   Collection Time    09/24/12  4:51 PM      Result Value Range   Sodium 141  135 - 145 mEq/L   Potassium 4.4  3.5 - 5.1 mEq/L   Chloride 106  96 - 112 mEq/L   BUN 15  6 - 23 mg/dL   Creatinine, Ser 1.61  0.50 - 1.10 mg/dL   Glucose, Bld 096 (*) 70 - 99 mg/dL   Calcium, Ion 0.45 (*) 1.13 - 1.30 mmol/L   TCO2 24  0 - 100 mmol/L   Hemoglobin 11.6 (*) 12.0 - 15.0 g/dL   HCT 40.9 (*) 81.1 - 91.4 %  GLUCOSE, CAPILLARY     Status: Abnormal   Collection Time    09/24/12  7:23 PM      Result Value Range   Glucose-Capillary 138 (*) 70 - 99 mg/dL  GLUCOSE, CAPILLARY     Status: Abnormal   Collection Time    09/24/12 11:11 PM      Result Value Range   Glucose-Capillary 119 (*) 70 - 99 mg/dL  CBC     Status: Abnormal   Collection Time    09/25/12  3:05 AM      Result Value Range   WBC 10.6 (*) 4.0 - 10.5 K/uL   RBC 3.42 (*) 3.87 - 5.11 MIL/uL   Hemoglobin 11.0 (*) 12.0 - 15.0 g/dL   HCT 78.2 (*) 95.6 - 21.3 %   MCV 91.8  78.0 - 100.0 fL   MCH 32.2  26.0 - 34.0 pg   MCHC 35.0  30.0 - 36.0 g/dL   RDW 08.6  57.8 - 46.9 %   Platelets 121 (*) 150 - 400 K/uL  BASIC METABOLIC PANEL     Status: Abnormal   Collection Time    09/25/12  3:05 AM      Result Value Range   Sodium 134 (*) 135 - 145 mEq/L   Comment: DELTA CHECK NOTED   Potassium 4.3  3.5 - 5.1 mEq/L   Chloride 104  96 - 112 mEq/L   CO2 24  19 - 32 mEq/L   Glucose, Bld 123 (*) 70 - 99 mg/dL   BUN 13  6 - 23 mg/dL   Creatinine, Ser 6.29  0.50 - 1.10 mg/dL   Calcium 8.0 (*) 8.4 - 10.5 mg/dL   GFR calc non Af Amer 69 (*) >90 mL/min   GFR calc Af Amer 80 (*) >90 mL/min   Comment:            The eGFR has been calculated     using the CKD EPI equation.     This calculation has not been     validated in all clinical     situations.     eGFR's persistently     <90 mL/min signify     possible Chronic Kidney Disease.  MAGNESIUM     Status:  Abnormal   Collection Time    09/25/12  3:05 AM      Result Value Range   Magnesium 3.0 (*) 1.5 - 2.5  mg/dL  GLUCOSE, CAPILLARY     Status: Abnormal   Collection Time    09/25/12  3:06 AM      Result Value Range   Glucose-Capillary 117 (*) 70 - 99 mg/dL  GLUCOSE, CAPILLARY     Status: Abnormal   Collection Time    09/25/12  7:21 AM      Result Value Range   Glucose-Capillary 113 (*) 70 - 99 mg/dL   Comment 1 Notify RN      Imaging: Imaging results have been reviewed  Assessment/Plan:   1. Principal Problem: 2.   ST elevation myocardial infarction (STEMI) of inferior wall 3. Active Problems: 4.   Chest pain 5.   Atrial fibrillation 6.   Hypertension 7.   Tobacco abuse 8.   Time Spent Directly with Patient:  20 minutes  Length of Stay:  LOS: 1 day   POD #1 emergency CABG X 4 after inf STEMI, POBA dom RCA to restore antegrade flow. LV gram was not performed so will need 2D echo prior to D/C. Exam benign except for soft BP on neo. NSR. EKG with RBBB. Labs OK. Lines coming out today. Up as tolerated. Progression per TCTS. Appreciate Dr. Sharee Pimple care.   Runell Gess 09/25/2012, 8:37 AM

## 2012-09-25 NOTE — Clinical Documentation Improvement (Signed)
THIS DOCUMENT IS NOT A PERMANENT PART OF THE MEDICAL RECORD  Please update your documentation with the medical record to reflect your response to this query. If you need help knowing how to do this please call [phone].  09/25/12  Dear [name] Todd Redwood  In an effort to better capture your patient's severity of illness, reflect appropriate length of stay and utilization of resources, a review of the patient medical record has revealed the following indicators.    Based on your clinical judgment, please clarify and document in a progress note and/or discharge summary the clinical condition associated with the following supporting information:   Possible Clinical Conditions?   " Expected Acute Blood Loss Anemia  " Acute Blood Loss Anemia  " Acute on chronic blood loss anemia  " Precipitous drop in Hematocrit  " Other Condition  " Cannot Clinically Determine    Risk Factors:  Expected blood loss anemia-mild, Hgb 11.0, per 8/05 progress notes. EBL: 1450 ml per 8/04 Anesthesia record.   Diagnostics: H&H on 8/04:   11.9/33.8  H&H on 8/04:    9.8/27.9  Treatments: Transfusion: IV fluids / plasma expanders: 8/04: Albumin 5%:  250 ml Cell saver:  285 ml LR:  1700 ml   Reviewed: Additional documentation provided per 8/08 discharge summary.               Thank You,  Marciano Sequin,  Clinical Documentation Specialist: [phone] Health Information Management [place]

## 2012-09-25 NOTE — Progress Notes (Signed)
Patient ID: [name] Todd, female   DOB: 04/11/1945, 67 y.o.   MRN: [id number] EVENING ROUNDS NOTE :     [address]       [place] [address]             [phone]                 1 Day Post-Op Procedure(s) (LRB): CORONARY ARTERY BYPASS GRAFTING (CABG) (N/A) ENDOVEIN HARVEST OF GREATER SAPHENOUS VEIN (Right) TRANSESOPHAGEAL ECHOCARDIOGRAM (TEE) (N/A)  Total Length of Stay:  LOS: 1 day  BP 97/68  Pulse 70  Temp(Src) 97.8 F (36.6 C) (Oral)  Resp 22  Ht 5' 1.81" (1.57 m)  Wt 149 lb 7.6 oz (67.8 kg)  BMI 27.51 kg/m2  SpO2 96%  .Intake/Output     08/04 0701 - 08/05 0700 08/05 0701 - 08/06 0700   P.O. 220    I.V. (mL/kg) 2784.3 (41.1) 463.4 (6.8)   Blood 285    IV Piggyback 1550 50   Total Intake(mL/kg) 4839.3 (71.4) 513.4 (7.6)   Urine (mL/kg/hr) 2290 (1.4) 550 (0.7)   Emesis/NG output 100 (0.1)    Blood 1450 (0.9)    Chest Tube 733 (0.5) 200 (0.3)   Total Output 4573 750   Net +266.3 -236.6          . sodium chloride 20 mL/hr at 09/25/12 0400  . sodium chloride 20 mL/hr at 09/24/12 1015  . sodium chloride    . dexmedetomidine Stopped (09/25/12 0200)  . lactated ringers 20 mL/hr at 09/25/12 0400  . nitroGLYCERIN Stopped (09/24/12 1010)  . phenylephrine (NEO-SYNEPHRINE) Adult infusion Stopped (09/25/12 1700)     Lab Results  Component Value Date   WBC 10.4 09/25/2012   HGB 11.2* 09/25/2012   HCT 33.0* 09/25/2012   PLT 103* 09/25/2012   GLUCOSE 115* 09/25/2012   CHOL 105 09/24/2012   TRIG 50 09/24/2012   HDL 37* 09/24/2012   LDLCALC 58 09/24/2012   ALT 22 08/31/2007   AST 24 08/31/2007   NA 133* 09/25/2012   K 3.9 09/25/2012   CL 101 09/25/2012   CREATININE 1.00 09/25/2012   BUN 13 09/25/2012   CO2 24 09/25/2012   TSH 4.804* 09/24/2012   INR 0.93 09/24/2012   HGBA1C 5.7* 09/24/2012   Mildly confused  Will change pain med to Adline Mango MD  Beeper [phone] Office [phone] 09/25/2012 6:24 PM

## 2012-09-26 ENCOUNTER — Encounter (HOSPITAL_COMMUNITY): Payer: Self-pay | Admitting: Cardiology

## 2012-09-26 ENCOUNTER — Inpatient Hospital Stay (HOSPITAL_COMMUNITY): Payer: Medicare Other

## 2012-09-26 DIAGNOSIS — Z951 Presence of aortocoronary bypass graft: Secondary | ICD-10-CM

## 2012-09-26 DIAGNOSIS — I4510 Unspecified right bundle-branch block: Secondary | ICD-10-CM | POA: Diagnosis present

## 2012-09-26 DIAGNOSIS — I2510 Atherosclerotic heart disease of native coronary artery without angina pectoris: Secondary | ICD-10-CM | POA: Diagnosis present

## 2012-09-26 LAB — GLUCOSE, CAPILLARY
Glucose-Capillary: 83 mg/dL (ref 70–99)
Glucose-Capillary: 92 mg/dL (ref 70–99)
Glucose-Capillary: 82 mg/dL (ref 70–99)

## 2012-09-26 LAB — BASIC METABOLIC PANEL
BUN: 12 mg/dL (ref 6–23)
CO2: 24 mEq/L (ref 19–32)
Chloride: 100 mEq/L (ref 96–112)
GFR calc non Af Amer: 75 mL/min — ABNORMAL LOW (ref >90)
Glucose, Bld: 99 mg/dL (ref 70–99)
Potassium: 3.7 mEq/L (ref 3.5–5.1)
Sodium: 130 mEq/L — ABNORMAL LOW (ref 135–145)

## 2012-09-26 LAB — CBC
HCT: 30.9 % — ABNORMAL LOW (ref 36.0–46.0)
Hemoglobin: 10.6 g/dL — ABNORMAL LOW (ref 12.0–15.0)
MCH: 31.8 pg (ref 26.0–34.0)
MCHC: 34.3 g/dL (ref 30.0–36.0)
MCV: 92.8 fL (ref 78.0–100.0)
RBC: 3.33 MIL/uL — ABNORMAL LOW (ref 3.87–5.11)

## 2012-09-26 MED ORDER — POTASSIUM CHLORIDE CRYS ER 20 MEQ PO TBCR
40.0000 meq | EXTENDED_RELEASE_TABLET | Freq: Every day | ORAL | Status: DC
  Administered 2012-09-27 – 2012-09-28 (×2): 40 meq via ORAL
  Filled 2012-09-26 (×2): qty 2

## 2012-09-26 MED ORDER — SODIUM CHLORIDE 0.9 % IV SOLN: 250.0000 mL | INTRAVENOUS | Status: DC | PRN

## 2012-09-26 MED ORDER — METOPROLOL TARTRATE 25 MG PO TABS
25.0000 mg | ORAL_TABLET | Freq: Two times a day (BID) | ORAL | Status: DC
  Administered 2012-09-26 – 2012-09-28 (×5): 25 mg via ORAL
  Filled 2012-09-26 (×5): qty 1

## 2012-09-26 MED ORDER — POTASSIUM CHLORIDE 10 MEQ/50ML IV SOLN
10.0000 meq | INTRAVENOUS | Status: AC
  Administered 2012-09-26 (×3): 10 meq via INTRAVENOUS

## 2012-09-26 MED ORDER — SODIUM CHLORIDE 0.9 % IJ SOLN
3.0000 mL | Freq: Two times a day (BID) | INTRAMUSCULAR | Status: DC
  Administered 2012-09-26 – 2012-09-28 (×4): 3 mL via INTRAVENOUS

## 2012-09-26 MED ORDER — SODIUM CHLORIDE 0.9 % IJ SOLN: 3.0000 mL | INTRAMUSCULAR | Status: DC | PRN

## 2012-09-26 MED ORDER — FUROSEMIDE 40 MG PO TABS
40.0000 mg | ORAL_TABLET | Freq: Every day | ORAL | Status: DC
  Administered 2012-09-27 – 2012-09-28 (×2): 40 mg via ORAL
  Filled 2012-09-26 (×2): qty 1

## 2012-09-26 MED ORDER — MOVING RIGHT ALONG BOOK
Freq: Once | Status: DC
  Filled 2012-09-26: qty 1

## 2012-09-26 NOTE — Evaluation (Signed)
Physical Therapy Evaluation Patient Details Name: Cuba Natarajan MRN: [id number] DOB: 10-26-1945 Today's Date: 09/26/2012 Time: 4098-1191 PT Time Calculation (min): 27 min  PT Assessment / Plan / Recommendation History of Present Illness   STEMI with emergent cath and  CABGx4  Clinical Impression  Patient demonstrates deficits in functional mobility and cognition as indicated below. Question patients ability as historian at this time, unable to determine baseline as no family is present. Patient appears to demonstrate some continued confusion throughout session. Extended time spent educated patient on sternal precautions and orienting patient to situation. Patient appeared alarmed that someone had performed surgery on her chest.  From a mobility stand point patient is limited by fatigue and requires assist for adherence to precautions. PT will benefit from continued skilled PT to address all deficits indicated and maximize functional mobility. Will need to confirm home environment and level of assist prior to discharge recommendations. At current levels patient will need continued skilled care HHPT with assist vs ST SNF. Will continue to work with patient and progress activity as tolerated.    PT Assessment  Patient needs continued PT services    Follow Up Recommendations   (TBD once assist and home setup can be confirmed)       Barriers to Discharge   Will need to speak with family to determine level of assist/support available    Equipment Recommendations  None recommended by PT    Recommendations for Other Services OT consult   Frequency Min 3X/week    Precautions / Restrictions Restrictions Weight Bearing Restrictions: Yes (sternal precautions)   Pertinent Vitals/Pain Patient reports no pain at this time      Mobility  Bed Mobility Bed Mobility: Not assessed Transfers Transfers: Sit to Stand;Stand to Sit Sit to Stand: 4: Min guard;4: Min assist Stand to Sit: 4: Min  assist Details for Transfer Assistance: VCs for sternal precaution complinace and technique, poor carry over Ambulation/Gait Ambulation/Gait Assistance: 4: Min guard;4: Min Environmental consultant (Feet): 110 Feet Assistive device: None (may benefit from rw) Ambulation/Gait Assistance Details: VCs for upright posture Gait Pattern: Step-through pattern;Decreased stride length;Trunk flexed;Narrow base of support Gait velocity: decreased General Gait Details: some instability noted with fatigue may benefit from use of rw        PT Diagnosis: Difficulty walking;Generalized weakness;Acute pain  PT Problem List: Decreased strength;Decreased activity tolerance;Decreased balance;Decreased mobility;Decreased coordination;Decreased cognition;Decreased knowledge of use of DME;Decreased safety awareness PT Treatment Interventions: DME instruction;Gait training;Stair training;Functional mobility training;Therapeutic activities;Therapeutic exercise;Balance training;Patient/family education     PT Goals(Current goals can be found in the care plan section) Acute Rehab PT Goals Patient Stated Goal: to go home with sister PT Goal Formulation: With patient Time For Goal Achievement: 10/10/12 Potential to Achieve Goals: Good  Visit Information  Last PT Received On: 09/26/12 Assistance Needed: +1 History of Present Illness: STEMI with emergent cath and  CABGx4       Prior Functioning  Home Living Family/patient expects to be discharged to:: Private residence Living Arrangements: Other relatives (sister) Available Help at Discharge: Family Type of Home: House Home Access: Stairs to enter Secretary/administrator of Steps: 6 Entrance Stairs-Rails: Right Home Layout: Two level;Bed/bath upstairs Alternate Level Stairs-Number of Steps: 12 Alternate Level Stairs-Rails: Right;Left Home Equipment: Walker - 2 wheels;Bedside commode;Shower seat Prior Function Level of Independence:  Independent Dominant Hand: Right    Cognition  Cognition Arousal/Alertness: Awake/alert Behavior During Therapy: Restless Overall Cognitive Status: No family/caregiver present to determine baseline cognitive functioning    Extremity/Trunk  Assessment Upper Extremity Assessment Upper Extremity Assessment: Defer to OT evaluation Lower Extremity Assessment Lower Extremity Assessment: Generalized weakness   Balance Balance Balance Assessed: Yes High Level Balance High Level Balance Activites: Side stepping;Backward walking;Direction changes;Turns;Head turns High Level Balance Comments: min assist required  End of Session PT - End of Session Equipment Utilized During Treatment: Gait belt Activity Tolerance: Patient limited by fatigue Patient left: in chair;with call bell/phone within reach Nurse Communication: Mobility status;Precautions  GP     Fabio Asa 09/26/2012, 3:39 PM Charlotte Crumb, PT DPT  [phone]

## 2012-09-26 NOTE — Progress Notes (Signed)
Subjective:  Awake and alert, confused last night but seems appropriate this am.  Objective:  Vital Signs in the last 24 hours: Temp:  [97.8 F (36.6 C)-99.3 F (37.4 C)] 97.9 F (36.6 C) (08/06 0750) Pulse Rate:  [70-100] 85 (08/06 0800) Resp:  [15-26] 22 (08/06 0800) BP: (83-134)/(29-80) 113/71 mmHg (08/06 0800) SpO2:  [87 %-97 %] 94 % (08/06 0800) Arterial Line BP: (93-139)/(56-92) 123/81 mmHg (08/05 1315) Weight:  [149 lb 4 oz (67.7 kg)] 149 lb 4 oz (67.7 kg) (08/06 0500)  Intake/Output from previous day:  Intake/Output Summary (Last 24 hours) at 09/26/12 0901 Last data filed at 09/26/12 0800  Gross per 24 hour  Intake 984.72 ml  Output   1445 ml  Net -460.28 ml    Physical Exam: General appearance: alert, cooperative and no distress Lungs: decreased breath sounds on Rt, carckles Lt base Heart: regular rate and rhythm   Rate: 90  Rhythm: normal sinus rhythm and premature atrial contractions (PAC)  Lab Results:  Recent Labs  09/25/12 1700 09/25/12 1712 09/26/12 0408  WBC 10.4  --  9.2  HGB 11.0* 11.2* 10.6*  PLT 103*  --  90*    Recent Labs  09/25/12 0305  09/25/12 1712 09/26/12 0408  NA 134*  --  133* 130*  K 4.3  --  3.9 3.7  CL 104  --  101 100  CO2 24  --   --  24  GLUCOSE 123*  --  115* 99  BUN 13  --  13 12  CREATININE 0.86  < > 1.00 0.81  < > = values in this interval not displayed. No results found for this basename: TROPONINI, CK, MB,  in the last 72 hours Hepatic Function Panel No results found for this basename: PROT, ALBUMIN, AST, ALT, ALKPHOS, BILITOT, BILIDIR, IBILI,  in the last 72 hours  Recent Labs  09/24/12 1200  CHOL 105    Recent Labs  09/24/12 0232  INR 0.93    Imaging: Dg Chest 2 View  09/26/2012   *RADIOLOGY REPORT*  Clinical Data: Status post chest tube removal.  CHEST - 2 VIEW  Comparison: Single view of the chest 09/25/2012.  Findings: Mediastinal drain and bilateral chest tubes have been removed.  Right IJ  sheath remains in place.  Swan-Ganz catheter has been removed.  No pneumothorax is identified.  The patient has small bilateral pleural effusions and basilar atelectasis.  IMPRESSION:  1.  Negative for pneumothorax after chest tube removal. 2.  Small bilateral pleural effusions and basilar atelectasis.   Original Report Authenticated By: Holley Dexter, M.D.   .    Cardiac Studies:  Assessment/Plan:   Principal Problem:   ST elevation myocardial infarction (STEMI) of inferior wall Active Problems:   CAD - RCA PCI followed by urgent CABG (Nl LVF)   S/P CABG x 4- urgent- 09/24/12   PAF (paroxysmal atrial fibrillation)- peri op   Hypertension   Tobacco abuse   RBBB (present pre-op)    PLAN: Start po beta blocker when able, pressors just stopped.   Corine Shelter PA-C Beeper [phone] 09/26/2012, 9:01 AM  Agree with note written by Corine Shelter [facility]  Looks great. VSS. NSR/ST. Labs and exam OK. Tx tele. Start BB. I think BP will tolerate. Nl progression per TCTS.   Looks great   Runell Gess 09/26/2012 9:34 AM

## 2012-09-26 NOTE — Progress Notes (Signed)
Pt transferred to [address] room 2018. Report called to [place], Charity fundraiser. Pt ambulated to new room pushing wheelchair. Tolerated well. Pt placed on telemetry monitor and assisted to recliner. Call bell within reach.   Alfonso Ellis, RN

## 2012-09-26 NOTE — Progress Notes (Signed)
Pt seems confused and unaware of personal limitations secondary to her recent CABG.  Unable to stay still, walking around room and halls cleaning and reorganizing and digging through the linen closets.  Oriented to place, month, and year, and self...but unsure of why she is here.  Also says inappropriate things such as "I live with my dead mother" and "they let me roam free on that other unit (2300)".  Bed and chair alarm are now utilized for pt safety, as well as a camera room and keeping the door open for better observation.  Will continue to monitor the patient closely and frequently. Ave Filter

## 2012-09-26 NOTE — Progress Notes (Addendum)
      [address]       [place] [address]             [phone]      2 Days Post-Op Procedure(s) (LRB): CORONARY ARTERY BYPASS GRAFTING (CABG) (N/A) ENDOVEIN HARVEST OF GREATER SAPHENOUS VEIN (Right) TRANSESOPHAGEAL ECHOCARDIOGRAM (TEE) (N/A)  Subjective:  Ms. Orris states she feels good.  She does state her [employer] is causing her some neck discomfort.  She states she is ready to go home.  Objective: Vital signs in last 24 hours: Temp:  [97.8 F (36.6 C)-99.3 F (37.4 C)] 97.9 F (36.6 C) (08/06 0750) Pulse Rate:  [70-100] 82 (08/06 0700) Cardiac Rhythm:  [-] Normal sinus rhythm (08/06 0700) Resp:  [15-26] 22 (08/06 0700) BP: (79-134)/(29-80) 95/59 mmHg (08/06 0700) SpO2:  [87 %-97 %] 96 % (08/06 0700) Arterial Line BP: (87-139)/(53-92) 123/81 mmHg (08/05 1315) Weight:  [149 lb 4 oz (67.7 kg)] 149 lb 4 oz (67.7 kg) (08/06 0500)  Hemodynamic parameters for last 24 hours: PAP: (35-38)/(18-21) 35/18 mmHg  Intake/Output from previous day: 08/05 0701 - 08/06 0700 In: 1179 [P.O.:250; I.V.:729; IV Piggyback:200] Out: 1610 [Urine:1410; Chest Tube:200]  General appearance: alert, cooperative and no distress Heart: regular rate and rhythm Lungs: diminished breath sounds bibasilar Abdomen: soft, non-tender; bowel sounds normal; no masses,  no organomegaly Extremities: edema trace Wound: clean and dry  Lab Results:  Recent Labs  09/25/12 1700 09/25/12 1712 09/26/12 0408  WBC 10.4  --  9.2  HGB 11.0* 11.2* 10.6*  HCT 32.3* 33.0* 30.9*  PLT 103*  --  90*   BMET:  Recent Labs  09/25/12 0305  09/25/12 1712 09/26/12 0408  NA 134*  --  133* 130*  K 4.3  --  3.9 3.7  CL 104  --  101 100  CO2 24  --   --  24  GLUCOSE 123*  --  115* 99  BUN 13  --  13 12  CREATININE 0.86  < > 1.00 0.81  CALCIUM 8.0*  --   --  8.2*  < > = values in this interval not displayed.  PT/INR:  Recent Labs  09/24/12 0232  LABPROT 12.3  INR 0.93   ABG      Component Value Date/Time   PHART 7.320* 09/24/2012 1645   HCO3 26.7* 09/24/2012 1645   TCO2 22 09/25/2012 1712   ACIDBASEDEF 4.0* 09/24/2012 1452   O2SAT 89.0 09/24/2012 1645   CBG (last 3)   Recent Labs  09/25/12 2317 09/26/12 0411 09/26/12 0752  GLUCAP 101* 83 92    Assessment/Plan: S/P Procedure(s) (LRB): CORONARY ARTERY BYPASS GRAFTING (CABG) (N/A) ENDOVEIN HARVEST OF GREATER SAPHENOUS VEIN (Right) TRANSESOPHAGEAL ECHOCARDIOGRAM (TEE) (N/A)  1. CV- NSR, rate in the 80s, hypotensive- off all cardiac drips, will hold off on start Beta Blocker due to blood pressure 2. Pulm- bilateral small pleural effusions, wean oxygen as tolerated, encouraged use of IS 3. Renal- creatinine WNL, remains volume overloaded, weight is up 19 lbs, will start Lasix if blood pressure will tolerate 4.Expected blood loss anemia- stable 5. CBGs- controlled, will continue SSIP today as patient on liquid diet, can likely d/c glucose checks in the morning 6. Dispo- patient stable, will transfer to 2000  LOS: 2 days    Lowella Dandy 09/26/2012   Chart reviewed, patient examined, agree with above. BP is better. Will start lopressor.

## 2012-09-26 NOTE — Progress Notes (Signed)
1422 Pt walked over recently for transfer. We will follow up tomorrow for ambulation. Luetta Nutting RNBSN

## 2012-09-27 DIAGNOSIS — I2510 Atherosclerotic heart disease of native coronary artery without angina pectoris: Secondary | ICD-10-CM

## 2012-09-27 LAB — TYPE AND SCREEN
Unit division: 0
Unit division: 0

## 2012-09-27 LAB — GLUCOSE, CAPILLARY
Glucose-Capillary: 98 mg/dL (ref 70–99)
Glucose-Capillary: 109 mg/dL — ABNORMAL HIGH (ref 70–99)
Glucose-Capillary: 72 mg/dL (ref 70–99)
Glucose-Capillary: 77 mg/dL (ref 70–99)

## 2012-09-27 LAB — BASIC METABOLIC PANEL
BUN: 13 mg/dL (ref 6–23)
Chloride: 101 mEq/L (ref 96–112)
GFR calc Af Amer: 83 mL/min — ABNORMAL LOW (ref >90)
GFR calc non Af Amer: 71 mL/min — ABNORMAL LOW (ref >90)
Potassium: 3.9 mEq/L (ref 3.5–5.1)
Sodium: 133 mEq/L — ABNORMAL LOW (ref 135–145)

## 2012-09-27 LAB — CBC
MCHC: 34.7 g/dL (ref 30.0–36.0)
Platelets: 117 10*3/uL — ABNORMAL LOW (ref 150–400)
RDW: 14.3 % (ref 11.5–15.5)
WBC: 8.4 10*3/uL (ref 4.0–10.5)

## 2012-09-27 LAB — POCT I-STAT 4, (NA,K, GLUC, HGB,HCT)
Glucose, Bld: 133 mg/dL — ABNORMAL HIGH (ref 70–99)
Glucose, Bld: 167 mg/dL — ABNORMAL HIGH (ref 70–99)
HCT: 32 % — ABNORMAL LOW (ref 36.0–46.0)
HCT: 20 % — ABNORMAL LOW (ref 36.0–46.0)
HCT: 30 % — ABNORMAL LOW (ref 36.0–46.0)
HCT: 27 % — ABNORMAL LOW (ref 36.0–46.0)
Potassium: 3.8 mEq/L (ref 3.5–5.1)
Potassium: 3.9 mEq/L (ref 3.5–5.1)
Sodium: 133 mEq/L — ABNORMAL LOW (ref 135–145)

## 2012-09-27 LAB — HEPATIC FUNCTION PANEL
Albumin: 3.3 g/dL — ABNORMAL LOW (ref 3.5–5.2)
Total Bilirubin: 0.4 mg/dL (ref 0.3–1.2)
Total Protein: 6.4 g/dL (ref 6.0–8.3)

## 2012-09-27 LAB — TSH: TSH: 3.037 u[IU]/mL (ref 0.350–4.500)

## 2012-09-27 LAB — POCT I-STAT 3, ART BLOOD GAS (G3+)
TCO2: 26 mmol/L (ref 0–100)
pH, Arterial: 7.429 (ref 7.350–7.450)

## 2012-09-27 NOTE — Progress Notes (Signed)
Patient ambulated 200 ft in hallway with RN independently. Returned to room d/t "getting tired." Assisted patient to bed. Call bell and telephone near.Mamie Levers

## 2012-09-27 NOTE — Progress Notes (Addendum)
Subjective:  A little confused at times.  Objective:  Vital Signs in the last 24 hours: Temp:  [97.8 F (36.6 C)-98.6 F (37 C)] 98.6 F (37 C) (08/07 0516) Pulse Rate:  [71-82] 82 (08/07 0516) Resp:  [18-22] 20 (08/07 0516) BP: (103-142)/(67-85) 142/85 mmHg (08/07 0516) SpO2:  [91 %-97 %] 91 % (08/07 0516) Weight:  [149 lb 1.6 oz (67.631 kg)] 149 lb 1.6 oz (67.631 kg) (08/07 0516)  Intake/Output from previous day:  Intake/Output Summary (Last 24 hours) at 09/27/12 1003 Last data filed at 09/27/12 0730  Gross per 24 hour  Intake    380 ml  Output    350 ml  Net     30 ml    Physical Exam: General appearance: alert, cooperative and no distress Lungs: clear Heart: regular rate and rhythm   Rate: 82  Rhythm: normal sinus rhythm  Lab Results:  Recent Labs  09/26/12 0408 09/27/12 0545  WBC 9.2 8.4  HGB 10.6* 11.4*  PLT 90* 117*    Recent Labs  09/26/12 0408 09/27/12 0545  NA 130* 133*  K 3.7 3.9  CL 100 101  CO2 24 26  GLUCOSE 99 131*  BUN 12 13  CREATININE 0.81 0.84   No results found for this basename: TROPONINI, CK, MB,  in the last 72 hours Hepatic Function Panel No results found for this basename: PROT, ALBUMIN, AST, ALT, ALKPHOS, BILITOT, BILIDIR, IBILI,  in the last 72 hours  Recent Labs  09/24/12 1200  CHOL 105   No results found for this basename: INR,  in the last 72 hours  Imaging: Dg Chest 2 View  09/26/2012   *RADIOLOGY REPORT*  Clinical Data: Status post chest tube removal.  CHEST - 2 VIEW  Comparison: Single view of the chest 09/25/2012.  Findings: Mediastinal drain and bilateral chest tubes have been removed.  Right IJ sheath remains in place.  Swan-Ganz catheter has been removed.  No pneumothorax is identified.  The patient has small bilateral pleural effusions and basilar atelectasis.  IMPRESSION:  1.  Negative for pneumothorax after chest tube removal. 2.  Small bilateral pleural effusions and basilar atelectasis.   Original Report  Authenticated By: Holley Dexter, M.D.    Cardiac Studies:  Assessment/Plan:   Principal Problem:   ST elevation myocardial infarction (STEMI) of inferior wall Active Problems:   CAD - RCA PCI followed by urgent CABG (Nl LVF)   S/P CABG x 4- urgent- 09/24/12   PAF (paroxysmal atrial fibrillation)- peri op   Hypertension   Tobacco abuse   RBBB (present pre-op)  PLAN: Same Rx, will follow.  Corine Shelter PA-C Beeper [phone] 09/27/2012, 10:03 AM  I have seen and evaluated the patient this PM along with Corine Shelter, PA. I agree with his findings, examination as well as impression recommendations.  BP seems to be stable on BB dose & may in fact be able to increase in the next 1-2 days. -- eventually would consider ARB/ACE-I Based upon CURE trial, would consider adding Plavix vs. Brilinta &reducing ASA for post POBA / STEMI medical therapy despite CABG. -- once further invasive procedures are indicated.  On statin - tolerating well.  Counseling for smoking cessation - ~5 min  MD Time with pt: 10 min  [name] Todd, M.D., M.S. [facility] [address] [place], [place]  [address]  [phone] Pager # [phone] 09/27/2012 12:14 PM

## 2012-09-27 NOTE — Progress Notes (Signed)
Patient refused to ambulate at this time. Explained need to ambulate 3x/day. Patient became angry and stated, " I already walked once and  I'm not walking again."[name] Todd

## 2012-09-27 NOTE — Progress Notes (Signed)
[address]       [place] [address]             [phone]      3 Days Post-Op  Procedure(s) (LRB): CORONARY ARTERY BYPASS GRAFTING (CABG) (N/A) ENDOVEIN HARVEST OF GREATER SAPHENOUS VEIN (Right) TRANSESOPHAGEAL ECHOCARDIOGRAM (TEE) (N/A) Subjective: Fairly confused but seems to re-orient with prompts  Objective  Telemetry sinus with occ pvc's  Temp:  [97.8 F (36.6 C)-98.6 F (37 C)] 98.6 F (37 C) (08/07 0516) Pulse Rate:  [72-92] 92 (08/07 1036) Resp:  [18-22] 20 (08/07 0516) BP: (103-142)/(68-85) 136/84 mmHg (08/07 1036) SpO2:  [91 %-97 %] 91 % (08/07 0516) Weight:  [149 lb 1.6 oz (67.631 kg)] 149 lb 1.6 oz (67.631 kg) (08/07 0516)   Intake/Output Summary (Last 24 hours) at 09/27/12 1230 Last data filed at 09/27/12 1215  Gross per 24 hour  Intake    720 ml  Output    700 ml  Net     20 ml       General appearance: alert, cooperative, distracted and no distress Neurologic: grossly intact except confusion Heart: regular rate and rhythm Lungs: mildly dim in l base Abdomen: benign Extremities: + LE edema Wound: incisions healing well  Lab Results:  Recent Labs  09/25/12 0305 09/25/12 1700  09/26/12 0408 09/27/12 0545  NA 134*  --   < > 130* 133*  K 4.3  --   < > 3.7 3.9  CL 104  --   < > 100 101  CO2 24  --   --  24 26  GLUCOSE 123*  --   < > 99 131*  BUN 13  --   < > 12 13  CREATININE 0.86 0.93  < > 0.81 0.84  CALCIUM 8.0*  --   --  8.2* 9.0  MG 3.0* 2.8*  --   --   --   < > = values in this interval not displayed. No results found for this basename: AST, ALT, ALKPHOS, BILITOT, PROT, ALBUMIN,  in the last 72 hours No results found for this basename: LIPASE, AMYLASE,  in the last 72 hours  Recent Labs  09/26/12 0408 09/27/12 0545  WBC 9.2 8.4  HGB 10.6* 11.4*  HCT 30.9* 32.9*  MCV 92.8 92.2  PLT 90* 117*   No results found for this basename: CKTOTAL, CKMB, TROPONINI,  in the last 72 hours No components found  with this basename: POCBNP,  No results found for this basename: DDIMER,  in the last 72 hours No results found for this basename: HGBA1C,  in the last 72 hours No results found for this basename: CHOL, HDL, LDLCALC, TRIG, CHOLHDL,  in the last 72 hours No results found for this basename: TSH, T4TOTAL, FREET3, T3FREE, THYROIDAB,  in the last 72 hours No results found for this basename: VITAMINB12, FOLATE, FERRITIN, TIBC, IRON, RETICCTPCT,  in the last 72 hours  Medications: Scheduled . acetaminophen  1,000 mg Oral Q6H   Or  . acetaminophen (TYLENOL) oral liquid 160 mg/5 mL  1,000 mg Per Tube Q6H  . aspirin EC  325 mg Oral Daily   Or  . aspirin  324 mg Per Tube Daily  . atorvastatin  80 mg Oral QPC supper  . bisacodyl  10 mg Oral Daily   Or  . bisacodyl  10 mg Rectal Daily  . bisacodyl  5 mg Oral Once  . docusate sodium  200 mg Oral  Daily  . furosemide  40 mg Oral Daily  . metoprolol tartrate  25 mg Oral BID  . moving right along book   Does not apply Once  . pantoprazole  40 mg Oral Daily  . potassium chloride  40 mEq Oral Daily  . sodium chloride  3 mL Intravenous Q12H  . sodium chloride  3 mL Intravenous Q12H     Radiology/Studies:  Dg Chest 2 View  09/26/2012   *RADIOLOGY REPORT*  Clinical Data: Status post chest tube removal.  CHEST - 2 VIEW  Comparison: Single view of the chest 09/25/2012.  Findings: Mediastinal drain and bilateral chest tubes have been removed.  Right IJ sheath remains in place.  Swan-Ganz catheter has been removed.  No pneumothorax is identified.  The patient has small bilateral pleural effusions and basilar atelectasis.  IMPRESSION:  1.  Negative for pneumothorax after chest tube removal. 2.  Small bilateral pleural effusions and basilar atelectasis.   Original Report Authenticated By: Holley Dexter, M.D.    INR: Will add last result for INR, ABG once components are confirmed Will add last 4 CBG results once components are  confirmed  Assessment/Plan: S/P Procedure(s) (LRB): CORONARY ARTERY BYPASS GRAFTING (CABG) (N/A) ENDOVEIN HARVEST OF GREATER SAPHENOUS VEIN (Right) TRANSESOPHAGEAL ECHOCARDIOGRAM (TEE) (N/A)  1 postop confusion- limit narcotics as able. 2 will order baseline LFT's and TSH, other labs appear stable. Doubt she needs nuero imaging at this time with nonfocal exam 3 push rehab/pulm toilet 4 cont diuresis for volume overload    LOS: 3 days    [name] Todd,[name] Todd 8/7/201412:30 PM

## 2012-09-27 NOTE — Progress Notes (Signed)
3 Days Post-Op Procedure(s) (LRB): CORONARY ARTERY BYPASS GRAFTING (CABG) (N/A) ENDOVEIN HARVEST OF GREATER SAPHENOUS VEIN (Right) TRANSESOPHAGEAL ECHOCARDIOGRAM (TEE) (N/A) Subjective:  No complaints Wants to go home   Objective: Vital signs in last 24 hours: Temp:  [97.8 F (36.6 C)-98.6 F (37 C)] 97.9 F (36.6 C) (08/07 1328) Pulse Rate:  [66-92] 66 (08/07 1328) Cardiac Rhythm:  [-] Normal sinus rhythm (08/07 0830) Resp:  [18-20] 18 (08/07 1328) BP: (115-142)/(68-85) 116/80 mmHg (08/07 1328) SpO2:  [91 %-96 %] 92 % (08/07 1328) Weight:  [67.631 kg (149 lb 1.6 oz)] 67.631 kg (149 lb 1.6 oz) (08/07 0516)  Hemodynamic parameters for last 24 hours:    Intake/Output from previous day: 08/06 0701 - 08/07 0700 In: 470 [P.O.:360; I.V.:60; IV Piggyback:50] Out: 525 [Urine:525] Intake/Output this shift: Total I/O In: 600 [P.O.:600] Out: 350 [Urine:350]  General appearance: alert and cooperative Neurologic: intact Heart: regular rate and rhythm, S1, S2 normal, no murmur, click, rub or gallop Lungs: clear to auscultation bilaterally Extremities: edema mild Wound: incision ok  Lab Results:  Recent Labs  09/26/12 0408 09/27/12 0545  WBC 9.2 8.4  HGB 10.6* 11.4*  HCT 30.9* 32.9*  PLT 90* 117*   BMET:  Recent Labs  09/26/12 0408 09/27/12 0545  NA 130* 133*  K 3.7 3.9  CL 100 101  CO2 24 26  GLUCOSE 99 131*  BUN 12 13  CREATININE 0.81 0.84  CALCIUM 8.2* 9.0    PT/INR: No results found for this basename: LABPROT, INR,  in the last 72 hours ABG    Component Value Date/Time   PHART 7.320* 09/24/2012 1645   HCO3 26.7* 09/24/2012 1645   TCO2 22 09/25/2012 1712   ACIDBASEDEF 4.0* 09/24/2012 1452   O2SAT 89.0 09/24/2012 1645   CBG (last 3)   Recent Labs  09/26/12 2121 09/27/12 0620 09/27/12 1121  GLUCAP 98 109* 72    Assessment/Plan: S/P Procedure(s) (LRB): CORONARY ARTERY BYPASS GRAFTING (CABG) (N/A) ENDOVEIN HARVEST OF GREATER SAPHENOUS VEIN  (Right) TRANSESOPHAGEAL ECHOCARDIOGRAM (TEE) (N/A) Mobilize Diuresis She should be able to go home soon. She says she is going home to her house and her sister lives there. I am not really sure of her home situation. She is a little goofy.   LOS: 3 days    [name] Todd K 09/27/2012

## 2012-09-27 NOTE — Progress Notes (Signed)
Physical Therapy Treatment Patient Details Name: [name] Todd MRN: [id number] DOB: Aug 07, 1945 Today's Date: 09/27/2012 Time: 4782-9562 PT Time Calculation (min): 28 min  PT Assessment / Plan / Recommendation  History of Present Illness STEMI with emergent cath and  CABGx4   PT Comments   Patient demonstrates some improvements in mobility today. Able to tolerated increased ambulation without difficulty using rw. Spoke with patient at length regarding situation, sternal precautions and expectations for mobility both in the hospital and at discharge. Patient still demonstrates some lack of awareness at this time. Concerned for compliance with follow up and precautions. Will continue to see acutely to progress activity as tolerated with plan for dc home with HHPT.   Follow Up Recommendations  Home health PT     Does the patient have the potential to tolerate intense rehabilitation     Barriers to Discharge        Equipment Recommendations  None recommended by PT    Recommendations for Other Services OT consult  Frequency Min 3X/week   Progress towards PT Goals Progress towards PT goals: Progressing toward goals  Plan Current plan remains appropriate    Precautions / Restrictions Precautions Precautions: Sternal Precaution Comments: extensive education on sternal precautions Restrictions Weight Bearing Restrictions: Yes   Pertinent Vitals/Pain No pain, VSS, no complaints    Mobility  Bed Mobility Bed Mobility: Not assessed Transfers Transfers: Sit to Stand;Stand to Sit Sit to Stand: 5: Supervision;From chair/3-in-1;From toilet Stand to Sit: 5: Supervision;To chair/3-in-1;To toilet Details for Transfer Assistance: supervision for safety  Ambulation/Gait Ambulation/Gait Assistance: 5: Supervision;4: Min guard Ambulation Distance (Feet): 640 Feet Assistive device: Rolling walker Ambulation/Gait Assistance Details: Vcs for upright posture and position within rw Gait Pattern:  Step-through pattern;Decreased stride length;Trunk flexed;Narrow base of support Gait velocity: decreased General Gait Details: some instability noted with fatigue may benefit from use of rw    Exercises     PT Diagnosis:    PT Problem List:   PT Treatment Interventions:     PT Goals (current goals can now be found in the care plan section) Acute Rehab PT Goals Patient Stated Goal: to go home with sister PT Goal Formulation: With patient Time For Goal Achievement: 10/10/12 Potential to Achieve Goals: Good  Visit Information  Last PT Received On: 09/27/12 Assistance Needed: +1 History of Present Illness: STEMI with emergent cath and  CABGx4    Subjective Data  Patient Stated Goal: to go home with sister   Cognition  Cognition Arousal/Alertness: Awake/alert Behavior During Therapy: Restless Overall Cognitive Status: Impaired/Different from baseline Area of Impairment: Safety/judgement;Awareness;Problem solving;Memory Memory: Decreased recall of precautions Safety/Judgement: Decreased awareness of deficits;Decreased awareness of safety Awareness: Intellectual Problem Solving: Slow processing;Requires verbal cues General Comments: Pt with decreased awareness of situation.  States she had an MI, then states maybe I had a stroke    Balance  High Level Balance High Level Balance Activites: Side stepping;Backward walking;Direction changes;Turns;Head turns High Level Balance Comments: min guard without assistive device  End of Session PT - End of Session Equipment Utilized During Treatment: Gait belt Activity Tolerance: Patient limited by fatigue Patient left: in chair;with call bell/phone within reach Nurse Communication: Mobility status;Precautions   GP     [name] Todd 09/27/2012, 4:40 PM [name] Todd, PT DPT  [phone]

## 2012-09-27 NOTE — Progress Notes (Signed)
CARDIAC REHAB PHASE I   PRE:  Rate/Rhythm: 86 SR    BP: sitting 100/60    SaO2: 96 2L, 91-92 RA  MODE:  Ambulation: 350 ft   POST:  Rate/Rhythm: 105 ST    BP: sitting 110/60     SaO2: 91 2L  Tolerated fairly, steady with RW. C/o fatigue. SaO2 low, used 2L. Not doing IS well. Return to recliner, put on chair alarm (pt had taken it off). ? Slight confusion, could answer questions appropriately. [id number]   [name] Todd CES, ACSM 09/27/2012 8:19 AM

## 2012-09-27 NOTE — Evaluation (Signed)
Occupational Therapy Evaluation Patient Details Name: [name] Todd MRN: [id number] DOB: [birth year] Today's Date: 09/27/2012 Time: 1000-1028 OT Time Calculation (min): 28 min  OT Assessment / Plan / Recommendation History of present illness STEMI with emergent cath and  CABGx4   Clinical Impression   Patient is s/p CABG surgery resulting in functional limitations due to the deficits listed below (see OT Problem List). Pt with signficant cognitive deficits which impacts her safety significantly.  Pt. Will need considerable practice and reinforcement in order to increase compliance with sternal precautions Patient will benefit from skilled OT to increase their safety and independence with ADL and functional mobility for ADL to allow facilitate discharge to venue listed below. Pt is moving quite well, but does not have a good understanding of sternal precautions due to cognitive deficits.  She will benefit from at minimum HHOT home eval to ensure safety and carry over of information into home environment    OT Assessment  Patient needs continued OT Services    Follow Up Recommendations  Home health OT;Supervision/Assistance - 24 hour    Barriers to Discharge Other (comment) (uncertain if she has 24 hour assist/sup)    Equipment Recommendations  None recommended by OT    Recommendations for Other Services    Frequency  Min 2X/week    Precautions / Restrictions Precautions Precautions: Sternal Precaution Comments: extensive education on sternal precautions Restrictions Weight Bearing Restrictions: Yes   Pertinent Vitals/Pain     ADL  Eating/Feeding: Independent Where Assessed - Eating/Feeding: Chair Grooming: Wash/dry hands;Wash/dry face;Teeth care;Brushing hair;Supervision/safety Where Assessed - Grooming: Unsupported standing Upper Body Bathing: Supervision/safety Where Assessed - Upper Body Bathing: Unsupported sitting Lower Body Bathing: Supervision/safety Where Assessed -  Lower Body Bathing: Unsupported sit to stand Upper Body Dressing: Supervision/safety Where Assessed - Upper Body Dressing: Unsupported sitting Lower Body Dressing: Supervision/safety Where Assessed - Lower Body Dressing: Unsupported sit to stand Toilet Transfer: Supervision/safety Toilet Transfer Method: Sit to stand;Stand pivot Acupuncturist: Regular height toilet Toileting - Clothing Manipulation and Hygiene: Supervision/safety Where Assessed - Toileting Clothing Manipulation and Hygiene: Standing Transfers/Ambulation Related to ADLs: supervision ADL Comments: Pt instructed in sternal precautions, rationale for sternal precautions, and the type of surgery she had.  Instructed pt on need for 24 hour supervision/assist at discharge, and extensively reviewed safety with BADLs    OT Diagnosis: Generalized weakness;Cognitive deficits  OT Problem List: Decreased activity tolerance;Decreased knowledge of use of DME or AE;Decreased knowledge of precautions;Decreased cognition OT Treatment Interventions: Self-care/ADL training;DME and/or AE instruction;Therapeutic activities;Cognitive remediation/compensation;Patient/family education   OT Goals(Current goals can be found in the care plan section) Acute Rehab OT Goals Patient Stated Goal: to go home with sister OT Goal Formulation: With patient Time For Goal Achievement: 09/27/12 Potential to Achieve Goals: Good ADL Goals Pt Will Perform Tub/Shower Transfer: with supervision;Tub transfer;ambulating;shower seat Additional ADL Goal #1: Pt will demonstrate understanding and compliance with sternal precautions during all BADLs  Visit Information  Last OT Received On: 09/27/12 Assistance Needed: +1 History of Present Illness: STEMI with emergent cath and  CABGx4       Prior Functioning     Home Living Family/patient expects to be discharged to:: Private residence Living Arrangements: Other relatives (sister - unable to confirm  if she will have 24 hour supervis) Available Help at Discharge: Family Type of Home: House Home Access: Stairs to enter Secretary/administrator of Steps: 6 Entrance Stairs-Rails: Right Home Layout: Two level;Bed/bath upstairs Alternate Level Stairs-Number of Steps: 12 Alternate Level Stairs-Rails: Right;Left  Home Equipment: Walker - 2 wheels;Bedside commode;Shower seat Prior Function Level of Independence: Independent Communication Communication: No difficulties Dominant Hand: Right         Vision/Perception     Cognition  Cognition Arousal/Alertness: Awake/alert Behavior During Therapy: Restless Overall Cognitive Status: Impaired/Different from baseline Area of Impairment: Safety/judgement;Awareness;Problem solving;Memory Memory: Decreased recall of precautions Safety/Judgement: Decreased awareness of deficits;Decreased awareness of safety Awareness: Intellectual Problem Solving: Slow processing;Requires verbal cues General Comments: Pt with decreased awareness of situation.  States she had an MI, then states maybe I had a stroke    Extremity/Trunk Assessment Upper Extremity Assessment Upper Extremity Assessment: Overall WFL for tasks assessed (within precautions)     Mobility Bed Mobility Bed Mobility: Not assessed Transfers Transfers: Sit to Stand;Stand to Sit Sit to Stand: 5: Supervision;From chair/3-in-1;From toilet Stand to Sit: 5: Supervision;To chair/3-in-1;To toilet Details for Transfer Assistance: supervision for safety      Exercise     Balance     End of Session OT - End of Session Activity Tolerance: Patient tolerated treatment well Patient left: in chair;with call bell/phone within reach;with chair alarm set Nurse Communication: Mobility status  GO     [name] Todd M 09/27/2012, 11:20 AM

## 2012-09-27 NOTE — Plan of Care (Signed)
Problem: Phase III Progression Outcomes Goal: Transfer to PCTU/Telemetry POD Outcome: Completed/Met Date Met:  09/27/12 POD 2

## 2012-09-28 MED ORDER — TRAMADOL HCL 50 MG PO TABS: 50.0000 mg | ORAL_TABLET | Freq: Four times a day (QID) | ORAL | Status: DC | PRN

## 2012-09-28 MED ORDER — LISINOPRIL 5 MG PO TABS: 5.0000 mg | ORAL_TABLET | Freq: Every day | ORAL | Status: DC

## 2012-09-28 MED ORDER — POTASSIUM CHLORIDE CRYS ER 20 MEQ PO TBCR: 20.0000 meq | EXTENDED_RELEASE_TABLET | Freq: Every day | ORAL | Status: DC

## 2012-09-28 MED ORDER — ASPIRIN 325 MG PO TBEC: 325.0000 mg | DELAYED_RELEASE_TABLET | Freq: Every day | ORAL | Status: DC

## 2012-09-28 MED ORDER — ATORVASTATIN CALCIUM 80 MG PO TABS: 80.0000 mg | ORAL_TABLET | Freq: Every day | ORAL | Status: DC

## 2012-09-28 MED ORDER — FUROSEMIDE 40 MG PO TABS: 40.0000 mg | ORAL_TABLET | Freq: Every day | ORAL | Status: DC

## 2012-09-28 MED ORDER — METOPROLOL TARTRATE 25 MG PO TABS: 25.0000 mg | ORAL_TABLET | Freq: Two times a day (BID) | ORAL | Status: DC

## 2012-09-28 MED ORDER — LISINOPRIL 5 MG PO TABS
5.0000 mg | ORAL_TABLET | Freq: Every day | ORAL | Status: DC
Start: 2012-09-28 — End: 2012-09-28
  Administered 2012-09-28: 5 mg via ORAL
  Filled 2012-09-28 (×2): qty 1

## 2012-09-28 NOTE — Progress Notes (Signed)
Subjective:  Up without problems  Objective:  Vital Signs in the last 24 hours: Temp:  [97.9 F (36.6 C)-98.6 F (37 C)] 98.2 F (36.8 C) (08/08 0355) Pulse Rate:  [66-92] 79 (08/08 0355) Resp:  [18] 18 (08/08 0355) BP: (116-145)/(80-91) 145/88 mmHg (08/08 0355) SpO2:  [92 %] 92 % (08/08 0355) Weight:  [143 lb 4.8 oz (65 kg)] 143 lb 4.8 oz (65 kg) (08/08 0355)  Intake/Output from previous day:  Intake/Output Summary (Last 24 hours) at 09/28/12 0848 Last data filed at 09/28/12 0730  Gross per 24 hour  Intake    963 ml  Output   1252 ml  Net   -289 ml    Physical Exam: General appearance: alert, cooperative and no distress Lungs: clear to auscultation bilaterally Heart: regular rate and rhythm   Rate: 78  Rhythm: normal sinus rhythm  Lab Results:  Recent Labs  09/26/12 0408 09/27/12 0545  WBC 9.2 8.4  HGB 10.6* 11.4*  PLT 90* 117*    Recent Labs  09/26/12 0408 09/27/12 0545  NA 130* 133*  K 3.7 3.9  CL 100 101  CO2 24 26  GLUCOSE 99 131*  BUN 12 13  CREATININE 0.81 0.84   No results found for this basename: TROPONINI, CK, MB,  in the last 72 hours Hepatic Function Panel  Recent Labs  09/27/12 1529  PROT 6.4  ALBUMIN 3.3*  AST 37  ALT 20  ALKPHOS 49  BILITOT 0.4  BILIDIR <0.1  IBILI NOT CALCULATED   No results found for this basename: CHOL,  in the last 72 hours No results found for this basename: INR,  in the last 72 hours  Imaging: Imaging results have been reviewed  Cardiac Studies:  Assessment/Plan:   Principal Problem:   ST elevation myocardial infarction (STEMI) of inferior wall Active Problems:   CAD - RCA PCI followed by urgent CABG (Nl LVF)   S/P CABG x 4- urgent- 09/24/12   PAF (paroxysmal atrial fibrillation)- peri op   Hypertension   Tobacco abuse   RBBB (present pre-op)    PLAN: Stable from cardiac standpoint, we will arrange office follow up. Home when social situation is clarified.   Corine Shelter PA-C Beeper  [phone] 09/28/2012, 8:48 AM   Agree with note written by Corine Shelter [facility]  Looks great!!! POD # 4 inf STEMI, POBA dom RCA with emergent CABG. VSS. Exam benign. Labs OK. Ambulating with [facility]. Nl progression per TCTS. F/U with me after D/C.   Runell Gess 09/28/2012 9:14 AM

## 2012-09-28 NOTE — Progress Notes (Signed)
CARDIAC REHAB PHASE I   2:45pm-3:35pm  Cardiac Rehab education was held until the end of the day when patients sister arrived.  Limited education was done due to patient being upset with sister and a confrontational situation arose. Patient was left in case manager and nurse's care.   Theresa Duty, [place] 09/28/2012 3:31 PM

## 2012-09-28 NOTE — Progress Notes (Addendum)
       [address]       [place] [address]             [phone]          4 Days Post-Op Procedure(s) (LRB): CORONARY ARTERY BYPASS GRAFTING (CABG) (N/A) ENDOVEIN HARVEST OF GREATER SAPHENOUS VEIN (Right) TRANSESOPHAGEAL ECHOCARDIOGRAM (TEE) (N/A)  Subjective: Feels well, wants to go home.    Objective: Vital signs in last 24 hours: Patient Vitals for the past 24 hrs:  BP Temp Temp src Pulse Resp SpO2 Weight  09/28/12 0355 145/88 mmHg 98.2 F (36.8 C) Oral 79 18 92 % 143 lb 4.8 oz (65 kg)  09/27/12 2033 144/91 mmHg 98.6 F (37 C) Oral 78 18 92 % -  09/27/12 1328 116/80 mmHg 97.9 F (36.6 C) Oral 66 18 92 % -  09/27/12 1036 136/84 mmHg - - 92 - - -   Current Weight  09/28/12 143 lb 4.8 oz (65 kg)     Intake/Output from previous day: 08/07 0701 - 08/08 0700 In: 843 [P.O.:840; I.V.:3] Out: 751 [Urine:750; Stool:1]    PHYSICAL EXAM:  Heart: RRR Lungs: Clear Wound: Clean and dry Extremities: Trace LE edema    Lab Results: CBC: Recent Labs  09/26/12 0408 09/27/12 0545  WBC 9.2 8.4  HGB 10.6* 11.4*  HCT 30.9* 32.9*  PLT 90* 117*   BMET:  Recent Labs  09/26/12 0408 09/27/12 0545  NA 130* 133*  K 3.7 3.9  CL 100 101  CO2 24 26  GLUCOSE 99 131*  BUN 12 13  CREATININE 0.81 0.84  CALCIUM 8.2* 9.0    PT/INR: No results found for this basename: LABPROT, INR,  in the last 72 hours    Assessment/Plan: S/P Procedure(s) (LRB): CORONARY ARTERY BYPASS GRAFTING (CABG) (N/A) ENDOVEIN HARVEST OF GREATER SAPHENOUS VEIN (Right) TRANSESOPHAGEAL ECHOCARDIOGRAM (TEE) (N/A) CV- SR, BPs trending up. Continue beta blocker, will resume low dose ACE-I. Vol overload- diurese. CRPI, pulm toilet. Disp- she apparently lives with her sister, but her home is currently in foreclosure, per information provided by a family member to the nursing staff.  Will get case management consult to see what she will require post-discharge.  She is medically  stable.   LOS: 4 days    COLLINS,GINA H 09/28/2012   Chart reviewed, patient examined, agree with above. It sounds like she does not really have anyone to take care of her and may not have a place to live soon.

## 2012-09-28 NOTE — Progress Notes (Signed)
CARDIAC REHAB PHASE I   PRE:  Rate/Rhythm: 80 SR  BP:  Sitting: 112/68     SaO2: 96 RA  MODE:  Ambulation: 350 ft   POST:  Rate/Rhythm: 85 SR  BP:  Sitting: 142/84     SaO2: 95 RA  8:40AM-9:13AM Patient tolerated walk well.  She walked at a slow but steady pace. Patient talked and joked the whole time.  Patient was educated on pursed lip breathing due to sats in the low 90's. Patient said she feels good!  Theresa Duty, [place] 09/28/2012 9:10 AM

## 2012-09-28 NOTE — Progress Notes (Signed)
Continued d/c instructions to patient and sister regarding medications, incision care, daily wts, nutrition, and when to seek medical help. Patient verbalized understanding. Patient provided "Moving Along" and CABG booklet. Patient has also viewed video. Patient's sister has agreed to stay with her until their house is foreclosed which is unknown. Home Health RN and CSW is being provided through [facility]. IV and monitor d/c'd. Patient transported via wheelchair to lobby for discharge home.[name] Todd

## 2012-09-28 NOTE — Progress Notes (Signed)
Physical Therapy Treatment Patient Details Name: [name] Todd MRN: [id number] DOB: 02/10/1946 Today's Date: 09/28/2012 Time: 6387-5643 PT Time Calculation (min): 14 min  PT Assessment / Plan / Recommendation  History of Present Illness STEMI with emergent cath and  CABGx4   PT Comments   Pt continues to show good progress towards PT goals, ambulating without assistive device, able to negotiate stairs and demonstrates good compliance with sternal precautions. Anticipate patient will be safe for dc home when medically ready.    Follow Up Recommendations  Home health PT     Does the patient have the potential to tolerate intense rehabilitation     Barriers to Discharge        Equipment Recommendations  None recommended by PT    Recommendations for Other Services OT consult  Frequency Min 3X/week   Progress towards PT Goals Progress towards PT goals: Progressing toward goals  Plan Current plan remains appropriate    Precautions / Restrictions Precautions Precautions: Sternal Precaution Comments: extensive education on sternal precautions Restrictions Weight Bearing Restrictions: Yes   Pertinent Vitals/Pain VSS    Mobility  Bed Mobility Bed Mobility: Not assessed Transfers Transfers: Sit to Stand;Stand to Sit Sit to Stand: 5: Supervision;From chair/3-in-1;From toilet Stand to Sit: 5: Supervision;To chair/3-in-1;To toilet Details for Transfer Assistance: supervision for safety  Ambulation/Gait Ambulation/Gait Assistance: 5: Supervision;4: Min guard Ambulation Distance (Feet): 500 Feet Assistive device: None Ambulation/Gait Assistance Details: 2 rest breaks taken, no need for assist Gait Pattern: Step-through pattern;Decreased stride length;Trunk flexed;Narrow base of support Gait velocity: decreased General Gait Details: steady today Stairs: Yes Stairs Assistance: 5: Supervision Stair Management Technique: One rail Right;Alternating pattern Number of Stairs: 6     Exercises     PT Diagnosis:    PT Problem List:   PT Treatment Interventions:     PT Goals (current goals can now be found in the care plan section) Acute Rehab PT Goals Patient Stated Goal: to go home with sister PT Goal Formulation: With patient Time For Goal Achievement: 10/10/12 Potential to Achieve Goals: Good  Visit Information  Assistance Needed: +1 History of Present Illness: STEMI with emergent cath and  CABGx4    Subjective Data  Patient Stated Goal: to go home with sister   Cognition  Cognition Arousal/Alertness: Awake/alert Behavior During Therapy: WFL for tasks assessed/performed Overall Cognitive Status: Within Functional Limits for tasks assessed Awareness: Anticipatory    Balance  High Level Balance High Level Balance Activites: Side stepping;Backward walking;Direction changes;Turns;Head turns High Level Balance Comments: supervision  End of Session PT - End of Session Equipment Utilized During Treatment: Gait belt Activity Tolerance: Patient limited by fatigue Patient left: in chair;with call bell/phone within reach Nurse Communication: Mobility status;Precautions   GP     Fabio Asa 09/28/2012, 1:32 PM Charlotte Crumb, PT DPT  [phone]

## 2012-09-28 NOTE — Progress Notes (Signed)
Assisted patient to bed. Chest tube sutures removed per order; steri-strips applied. EPW removed with ends intact. No bleeding or ectopy noted to sites. Patient instructed to lay supine in bed x1 hr; verbalized understanding. Instructed to notify RN of any s/s of distress. VSS. Call bell and phone near. Will monitor.Mamie Levers

## 2012-09-28 NOTE — Discharge Summary (Signed)
[address]       [place] [address]             [phone]              Discharge Summary  Name: [name] Todd DOB: 1945-11-21 67 y.o. MRN: [id number]   Admission Date: 09/24/2012 Discharge Date: 09/28/2012    Admitting Diagnosis: Chest pain    Discharge Diagnosis:  Acute inferior myocardial infarction Severe three vessel coronary artery disease Expected postoperative blood loss anemia  Past Medical History  Diagnosis Date  . Dysmenorrhea   . Breast mass   . Estrogen deficiency   . Increased blood pressure (not hypertension)   . Endometriosis   . Hypertension       Procedures: CORONARY ARTERY BYPASS GRAFTING x 4 (Left internal mammary artery to left anterior descending, sequential saphenous vein graft to diagonal and obtuse marginal, saphenous vein graft to posterior descending) ENDOSCOPIC VEIN HARVEST RIGHT LEG - 09/24/2012    HPI:  The patient is a 67 y.o. female with a history of hypertension who awoke with chest pain on the day of admission.  The pain continued to worsen, and she called EMS.  EKG in the field showed inferior ST elevation.  She was treated with sublingual nitroglycerin, aspirin, and fentanyl.  She was brought to the ER at [facility] for evaluation.  A Code STEMI was called, and she was taken directly to the cath lab for cardiac catheterization by Dr. Allyson Sabal.  This revealed an occluded RCA with high grade proximal LAD and OM stenoses. Flow was reestablished down the RCA, but it was severely and diffusely diseased throughout the main vessel. She has continued to have back pain, and a cardiac surgery consult was obtained for consideration of surgical revascularization.  Dr. Laneta Simmers saw the patient in the cath lab and reviewed her films and felt that she would benefit from emergent CABG. All risks, benefits and alternatives of surgery were explained in detail, and the patient agreed to proceed.     Hospital Course:  The patient  was admitted to [facility] on 09/24/2012. The patient was taken to the operating room and underwent the above procedure.    The postoperative course has been notable for labile blood pressure.  She initially was hypotensive, requiring pressors, but these were ultimately weaned and discontinued. Since that time, her blood pressures have been trending up, and she was started on a beta blocker and ACE-I.  She has been mildly volume overloaded, and was started on Lasix, to which she is responding well.  She is ambulating with cardiac rehab and progressing well with mobility.  She is currently medically stable for discharge on today's date.     Recent vital signs:  Filed Vitals:   09/28/12 0355  BP: 145/88  Pulse: 79  Temp: 98.2 F (36.8 C)  Resp: 18    Recent laboratory studies:  CBC: Recent Labs  09/26/12 0408 09/27/12 0545  WBC 9.2 8.4  HGB 10.6* 11.4*  HCT 30.9* 32.9*  PLT 90* 117*   BMET:  Recent Labs  09/26/12 0408 09/27/12 0545  NA 130* 133*  K 3.7 3.9  CL 100 101  CO2 24 26  GLUCOSE 99 131*  BUN 12 13  CREATININE 0.81 0.84  CALCIUM 8.2* 9.0    PT/INR: No results found for this basename: LABPROT, INR,  in the last 72 hours   Discharge Medications:     Medication List  STOP taking these medications       aliskiren 150 MG tablet  Commonly known as:  TEKTURNA     amLODipine 5 MG tablet  Commonly known as:  NORVASC     BC HEADACHE PO     ibuprofen 200 MG tablet  Commonly known as:  ADVIL,MOTRIN      TAKE these medications       aspirin 325 MG EC tablet  Take 1 tablet (325 mg total) by mouth daily.     atorvastatin 80 MG tablet  Commonly known as:  LIPITOR  Take 1 tablet (80 mg total) by mouth daily after supper.     Estradiol 0.25 MG/0.25GM Gel  Place 1 application onto the skin daily.     furosemide 40 MG tablet  Commonly known as:  LASIX  Take 1 tablet (40 mg total) by mouth daily. X 3 days     lisinopril 5 MG tablet  Commonly known as:   PRINIVIL,ZESTRIL  Take 1 tablet (5 mg total) by mouth daily.     metoprolol tartrate 25 MG tablet  Commonly known as:  LOPRESSOR  Take 1 tablet (25 mg total) by mouth 2 (two) times daily.     potassium chloride SA 20 MEQ tablet  Commonly known as:  K-DUR,KLOR-CON  Take 1 tablet (20 mEq total) by mouth daily. X 3 days     traMADol 50 MG tablet  Commonly known as:  ULTRAM  Take 1 tablet (50 mg total) by mouth every 6 (six) hours as needed for pain.          Discharge Instructions:  The patient is to refrain from driving, heavy lifting or strenuous activity.  May shower daily and clean incisions with soap and water.  May resume regular diet.   Follow Up:        Follow-up Information   Follow up with Runell Gess, MD. (office will call you)    Contact information:   [address] [place] [address] [phone]       Follow up with Alleen Borne, MD In 3 weeks. (Office will contact you with an appointment)    Contact information:   [address] [place] [address] [phone]      The patient has been discharged on:  1.Beta Blocker: Yes [ x ]  No [ ]   If No, reason:    2.Ace Inhibitor/ARB: Yes [x ]  No [  ]  If No, reason:    3.Statin: Yes [ x ]  No [ ]   If No, reason:    4.Ecasa: Yes [x  ]  No [ ]   If No, reason:   [name] H 09/28/2012, 10:13 AM

## 2012-10-02 ENCOUNTER — Telehealth: Payer: Self-pay | Admitting: Cardiovascular Disease

## 2012-10-02 NOTE — Telephone Encounter (Signed)
Pt was returning someone's call. She did not know who called her.

## 2012-10-02 NOTE — Telephone Encounter (Signed)
Message forwarded to K. Petra Kuba, Charity fundraiser.  No call from Triage.

## 2012-10-03 ENCOUNTER — Other Ambulatory Visit: Payer: Self-pay | Admitting: *Deleted

## 2012-10-03 DIAGNOSIS — G8918 Other acute postprocedural pain: Secondary | ICD-10-CM

## 2012-10-03 MED ORDER — HYDROCODONE-ACETAMINOPHEN 10-325 MG PO TABS: 1.0000 | ORAL_TABLET | ORAL | Status: DC | PRN

## 2012-10-03 NOTE — Telephone Encounter (Signed)
She just had bypass surgery.  She needs to call the surgeons.

## 2012-10-03 NOTE — Telephone Encounter (Signed)
[name] called to say that the Tramadol was not giving her any relief of her pain.  She was in tears.  Consult was done and Norco (generic) 10-325mg  was substituted.  She is aware.

## 2012-10-03 NOTE — Telephone Encounter (Signed)
Returned call.  Left message w/ female answering to have pt call back before 4pm.

## 2012-10-03 NOTE — Telephone Encounter (Signed)
Please call- need to change her Tramadol HCL-she says it is not doing her any good.

## 2012-10-03 NOTE — Telephone Encounter (Signed)
Pt called back.  Pt informed message received and per Samara Deist, Dr. Hazle Coca nurse, she needs to contact the surgeons for pain medicine.  Pt verbalized understanding and agreed w/ plan.  Pt given contact number for Dr. Laneta Simmers.

## 2012-10-03 NOTE — Telephone Encounter (Signed)
Returned call.  Female answering stated pt is in a place she can't come to the phone.  Message left for pt to call back today before 4pm.

## 2012-10-03 NOTE — Telephone Encounter (Signed)
Pt was returning your call. Medication is not helping and needs something else.

## 2012-10-07 ENCOUNTER — Other Ambulatory Visit: Payer: Self-pay | Admitting: Cardiothoracic Surgery

## 2012-10-08 ENCOUNTER — Other Ambulatory Visit: Payer: Self-pay | Admitting: *Deleted

## 2012-10-08 ENCOUNTER — Telehealth: Payer: Self-pay | Admitting: Cardiovascular Disease

## 2012-10-08 DIAGNOSIS — G8918 Other acute postprocedural pain: Secondary | ICD-10-CM

## 2012-10-08 MED ORDER — OXYCODONE-ACETAMINOPHEN 5-325 MG PO TABS: 1.0000 | ORAL_TABLET | ORAL | Status: DC | PRN

## 2012-10-08 NOTE — Telephone Encounter (Signed)
Ms. Cales says the Awanda Mink is not taking care of her pain and is requesting Percocet.  I have requested signature from the MD in the office today and have informed her.

## 2012-10-08 NOTE — Telephone Encounter (Signed)
Returned call.  Pt informed message received.  Pt also informed Dr. Laneta Simmers did her surgery, not Dr. Allyson Sabal.  Pt reminded RN gave number to Dr. Sharee Pimple office last week.  Pt lost paper w/ number and number given again.  Pt agreed to call Dr. Sharee Pimple office r/t her pain meds.

## 2012-10-08 NOTE — Telephone Encounter (Signed)
Pt had surgery with Dr. Allyson Todd on August 4th and she said that she is in pain and the hydrocodone that she has been taking does not work and she needs something stronger. She stated that she didn't know who else to call.

## 2012-10-15 ENCOUNTER — Other Ambulatory Visit: Payer: Self-pay | Admitting: *Deleted

## 2012-10-15 DIAGNOSIS — G8918 Other acute postprocedural pain: Secondary | ICD-10-CM

## 2012-10-15 MED ORDER — OXYCODONE-ACETAMINOPHEN 5-325 MG PO TABS: 1.0000 | ORAL_TABLET | ORAL | Status: DC | PRN

## 2012-10-23 ENCOUNTER — Other Ambulatory Visit: Payer: Self-pay | Admitting: *Deleted

## 2012-10-23 DIAGNOSIS — I2510 Atherosclerotic heart disease of native coronary artery without angina pectoris: Secondary | ICD-10-CM

## 2012-10-24 ENCOUNTER — Ambulatory Visit: Payer: Self-pay | Admitting: Surgery

## 2012-10-24 ENCOUNTER — Other Ambulatory Visit: Payer: Self-pay | Admitting: *Deleted

## 2012-10-24 DIAGNOSIS — G8918 Other acute postprocedural pain: Secondary | ICD-10-CM

## 2012-10-24 MED ORDER — HYDROCODONE-ACETAMINOPHEN 7.5-325 MG PO TABS: 1.0000 | ORAL_TABLET | ORAL | Status: DC | PRN

## 2012-10-29 ENCOUNTER — Other Ambulatory Visit: Payer: Self-pay | Admitting: Surgery

## 2012-11-07 ENCOUNTER — Other Ambulatory Visit: Payer: Self-pay | Admitting: *Deleted

## 2012-11-07 ENCOUNTER — Encounter: Payer: Self-pay | Admitting: Surgery

## 2012-11-07 ENCOUNTER — Ambulatory Visit
Admission: RE | Admit: 2012-11-07 | Discharge: 2012-11-07 | Disposition: A | Discharge status: Home / Self Care | Payer: Medicare Other | Source: Ambulatory Visit | Attending: Surgery | Admitting: Surgery

## 2012-11-07 ENCOUNTER — Ambulatory Visit (INDEPENDENT_AMBULATORY_CARE_PROVIDER_SITE_OTHER): Payer: Self-pay | Admitting: Surgery

## 2012-11-07 VITALS — BP 148/92 | HR 102 | Resp 16 | Ht 64.0 in | Wt 126.0 lb

## 2012-11-07 DIAGNOSIS — G8918 Other acute postprocedural pain: Secondary | ICD-10-CM

## 2012-11-07 DIAGNOSIS — I2510 Atherosclerotic heart disease of native coronary artery without angina pectoris: Secondary | ICD-10-CM

## 2012-11-07 DIAGNOSIS — Z951 Presence of aortocoronary bypass graft: Secondary | ICD-10-CM

## 2012-11-07 MED ORDER — HYDROCODONE-ACETAMINOPHEN 7.5-325 MG PO TABS: ORAL_TABLET | ORAL | Status: DC

## 2012-11-07 NOTE — Progress Notes (Signed)
      [address]       [place] [address]             [phone]         HPI:  Patient returns for routine postoperative follow-up having undergone coronary bypass graft surgery x4 on 09/24/2012. The patient's early postoperative recovery while in the hospital was notable for an uncomplicated postoperative course. Since hospital discharge the patient reports that she has been feeling well. She has already returned to driving. She has also returned to smoking.   Current Outpatient Prescriptions  Medication Sig Dispense Refill  . aspirin EC 325 MG EC tablet Take 1 tablet (325 mg total) by mouth daily.  30 tablet  0  . Estradiol 0.25 MG/0.25GM GEL Place 1 application onto the skin daily.      Marland Kitchen HYDROcodone-acetaminophen (NORCO) 7.5-325 MG per tablet TAKE 1 TABLET EVERY 4 HOURS AS NEEDED FOR PAIN  40 tablet  0  . atorvastatin (LIPITOR) 80 MG tablet Take 1 tablet (80 mg total) by mouth daily after supper.  30 tablet  1  . lisinopril (PRINIVIL,ZESTRIL) 5 MG tablet Take 1 tablet (5 mg total) by mouth daily.  30 tablet  1  . metoprolol tartrate (LOPRESSOR) 25 MG tablet Take 1 tablet (25 mg total) by mouth 2 (two) times daily.  60 tablet  1   No current facility-administered medications for this visit.    Physical Exam: BP 148/92  Pulse 102  Resp 16  Ht 5\' 4"  (1.626 m)  Wt 126 lb (57.153 kg)  BMI 21.62 kg/m2  SpO2 97% She looks well. Cardiac exam shows a regular rate and rhythm with normal heart sounds. Lung exam is clear. The chest incision is healing well and sternum is stable. The right leg incisions are healing well and there is no peripheral edema.  Diagnostic Tests:  *RADIOLOGY REPORT*   Clinical Data: Post CABG, weakness, follow-up   CHEST - 2 VIEW   Comparison: Chest x-ray of 09/26/2012   Findings: Aeration of the lungs has improved.  There is resolution of the basilar atelectasis with perhaps a tiny left effusion remaining.  Mild  cardiomegaly is stable.  Median sternotomy sutures are intact.  Hardware for fixation of the lumbar spine is noted.   IMPRESSION: Improved aeration with resolution of basilar atelectasis.     Original Report Authenticated By: Dwyane Dee, M.D.     Impression:  Overall she is making good progress following her surgery. I strongly encouraged her to quit smoking. I asked her not to lift anything heavier than 10 pounds for 3 months postoperatively.  Plan:  She will followup with Dr. Allyson Sabal on Friday of this week. She will return to see me if she develops any problems with her incisions.

## 2012-11-09 ENCOUNTER — Ambulatory Visit: Payer: Medicare Other | Admitting: Physician Assistant

## 2012-11-14 ENCOUNTER — Other Ambulatory Visit: Payer: Self-pay | Admitting: Surgery

## 2012-11-14 ENCOUNTER — Other Ambulatory Visit: Payer: Self-pay

## 2012-11-14 DIAGNOSIS — G8918 Other acute postprocedural pain: Secondary | ICD-10-CM

## 2012-11-14 MED ORDER — HYDROCODONE-ACETAMINOPHEN 5-325 MG PO TABS: 1.0000 | ORAL_TABLET | Freq: Four times a day (QID) | ORAL | Status: DC | PRN

## 2012-11-14 NOTE — Telephone Encounter (Signed)
RX for Norco 5/325 mg # 40 no refills called to pharm

## 2012-11-20 ENCOUNTER — Other Ambulatory Visit: Payer: Self-pay | Admitting: *Deleted

## 2012-11-20 DIAGNOSIS — G8918 Other acute postprocedural pain: Secondary | ICD-10-CM

## 2012-11-20 MED ORDER — HYDROCODONE-ACETAMINOPHEN 7.5-325 MG PO TABS: 1.0000 | ORAL_TABLET | ORAL | Status: DC | PRN

## 2012-11-20 NOTE — Telephone Encounter (Signed)
States the Norco 5/325 mg are not relieving her pain in her chest post CABG.

## 2012-12-29 ENCOUNTER — Other Ambulatory Visit: Payer: Self-pay | Admitting: Surgery

## 2014-03-06 ENCOUNTER — Encounter (HOSPITAL_COMMUNITY): Payer: Self-pay | Admitting: Cardiovascular Disease

## 2014-10-18 ENCOUNTER — Encounter (HOSPITAL_COMMUNITY): Payer: Self-pay

## 2014-10-18 ENCOUNTER — Emergency Department (HOSPITAL_COMMUNITY)
Admission: EM | Admit: 2014-10-18 | Discharge: 2014-10-18 | Disposition: A | Discharge status: Home / Self Care | Payer: Medicare HMO | Attending: Emergency Medicine | Admitting: Emergency Medicine

## 2014-10-18 DIAGNOSIS — Z8742 Personal history of other diseases of the female genital tract: Secondary | ICD-10-CM | POA: Diagnosis not present

## 2014-10-18 DIAGNOSIS — I10 Essential (primary) hypertension: Secondary | ICD-10-CM | POA: Insufficient documentation

## 2014-10-18 DIAGNOSIS — M542 Cervicalgia: Secondary | ICD-10-CM | POA: Diagnosis present

## 2014-10-18 DIAGNOSIS — Z7982 Long term (current) use of aspirin: Secondary | ICD-10-CM | POA: Diagnosis not present

## 2014-10-18 DIAGNOSIS — Z720 Tobacco use: Secondary | ICD-10-CM | POA: Diagnosis not present

## 2014-10-18 DIAGNOSIS — Z79899 Other long term (current) drug therapy: Secondary | ICD-10-CM | POA: Insufficient documentation

## 2014-10-18 DIAGNOSIS — M436 Torticollis: Secondary | ICD-10-CM | POA: Diagnosis not present

## 2014-10-18 MED ORDER — CYCLOBENZAPRINE HCL 10 MG PO TABS
5.0000 mg | ORAL_TABLET | Freq: Once | ORAL | Status: AC
  Administered 2014-10-18: 5 mg via ORAL
  Filled 2014-10-18: qty 1

## 2014-10-18 MED ORDER — CYCLOBENZAPRINE HCL 5 MG PO TABS: 5.0000 mg | ORAL_TABLET | Freq: Two times a day (BID) | ORAL | Status: DC | PRN

## 2014-10-18 NOTE — ED Provider Notes (Signed)
CSN: [id number]     Arrival date & time 10/18/14  2137 History  This chart was scribed for Kerrie Buffalo, NP working with Tilden Fossa, MD by Evon Slack, ED Scribe. This patient was seen in room TR07C/TR07C and the patient's care was started at 9:50 PM. 9     Chief Complaint  Patient presents with  . Neck Pain   Patient is a 69 y.o. female presenting with neck pain. The history is provided by the patient. No language interpreter was used.  Neck Pain Associated symptoms: no chest pain and no numbness    HPI Comments: [name] Todd is a 69 y.o. female brought in by ambulance, who presents to the Emergency Department complaining of left sided neck pain onset 2 nights prior. She states that the pain radiates slightly into the left shoulder. Pt state that she feels as if she slept on her neck wrong. Pt states that that pain is worse when turning her head to the left. Pt state that she took 1 BC powder with slight relief.  Pt states that she is complaint with taking her medications. Pt denies CP, SOB, ear pain, numbness or tingling. Pt denies fall or injury.   Past Medical History  Diagnosis Date  . Dysmenorrhea   . Breast mass   . Estrogen deficiency   . Increased blood pressure (not hypertension)   . Endometriosis   . Hypertension    Past Surgical History  Procedure Laterality Date  . Abdominal hysterectomy    . Back surgery    . Coronary artery bypass graft N/A 09/24/2012    Procedure: CORONARY ARTERY BYPASS GRAFTING (CABG);  Surgeon: Alleen Borne, MD;  Location: [facility] OR;  Service: Open Heart Surgery;  Laterality: N/A;  . Endovein harvest of greater saphenous vein Right 09/24/2012    Procedure: ENDOVEIN HARVEST OF GREATER SAPHENOUS VEIN;  Surgeon: Alleen Borne, MD;  Location: [facility] OR;  Service: Open Heart Surgery;  Laterality: Right;  . Tee without cardioversion N/A 09/24/2012    Procedure: TRANSESOPHAGEAL ECHOCARDIOGRAM (TEE);  Surgeon: Alleen Borne, MD;  Location: [facility] OR;  Service: Open  Heart Surgery;  Laterality: N/A;   Family History  Problem Relation Age of Onset  . Diabetes Maternal Grandmother   . Coronary artery disease Sister    Social History  Substance Use Topics  . Smoking status: Current Every Day Smoker    Types: Cigarettes  . Smokeless tobacco: Never Used  . Alcohol Use: No   OB History    Gravida Para Term Preterm AB TAB SAB Ectopic Multiple Living   0 0 0 0 0 0 0 0 0 0       Review of Systems  HENT: Negative for ear pain.   Respiratory: Negative for shortness of breath.   Cardiovascular: Negative for chest pain.  Musculoskeletal: Positive for neck pain.  Neurological: Negative for numbness.  All other systems reviewed and are negative.    Allergies  Review of patient's allergies indicates no known allergies.  Home Medications   Prior to Admission medications   Medication Sig Start Date End Date Taking? Authorizing Provider  aspirin EC 325 MG EC tablet Take 1 tablet (325 mg total) by mouth daily. 09/28/12   Wilmon Pali, PA-C  atorvastatin (LIPITOR) 80 MG tablet Take 1 tablet (80 mg total) by mouth daily after supper. 09/28/12   Wilmon Pali, PA-C  cyclobenzaprine (FLEXERIL) 5 MG tablet Take 1 tablet (5 mg total) by mouth 2 (two) times daily  as needed for muscle spasms. 10/18/14   Ariel Wingrove Orlene Och, NP  Estradiol 0.25 MG/0.25GM GEL Place 1 application onto the skin daily.    Historical Provider, MD  HYDROcodone-acetaminophen (NORCO) 7.5-325 MG per tablet Take 1 tablet by mouth every 4 (four) hours as needed for pain. 11/20/12   Alleen Borne, MD  lisinopril (PRINIVIL,ZESTRIL) 5 MG tablet Take 1 tablet (5 mg total) by mouth daily. 09/28/12   Wilmon Pali, PA-C  metoprolol tartrate (LOPRESSOR) 25 MG tablet Take 1 tablet (25 mg total) by mouth 2 (two) times daily. 09/28/12   Gina L Collins, PA-C   BP 140/100 mmHg  Pulse 103  Temp(Src) 98.4 F (36.9 C) (Oral)  Resp 16  Ht  (1.626 m)  Wt 130 lb (58.968 kg)  BMI 22.30 kg/m2  SpO2 95%    Physical Exam  Constitutional: She is oriented to person, place, and time. She appears well-developed and well-nourished. No distress.  HENT:  Head: Normocephalic and atraumatic.  Left Ear: No mastoid tenderness.  Eyes: Conjunctivae and EOM are normal.  Neck: Neck supple. No JVD present. Muscular tenderness present. No spinous process tenderness present. No tracheal deviation present. Decreased range of motion: due to pain.    Cardiovascular: Regular rhythm and normal heart sounds.   Pulmonary/Chest: Effort normal. No respiratory distress. She has no wheezes. She has no rales.  Musculoskeletal: Normal range of motion.  Neurological: She is alert and oriented to person, place, and time.  Skin: Skin is warm and dry.  Psychiatric: She has a normal mood and affect. Her behavior is normal.  Nursing note and vitals reviewed.   ED Course  Procedures (including critical care time) Flexeril 5 mg PO and warm blanket to neck. Patient had great improvement.  Dr. Madilyn Hook in to see the patient and discuss plan of care. Will give Rx for Flexeril 5 mg but patient advised of side effects including drowsiness and increased chance of falling. Patient voices understanding.   DIAGNOSTIC STUDIES: Oxygen Saturation is 97% on RA, normal by my interpretation.    COORDINATION OF CARE: 9:52 PM-Discussed treatment plan with pt at bedside and pt agreed to plan.     MDM  69 y.o. female with left side neck pain that she woke with yesterday. Stable for d/c without cardiac symptoms.   Final diagnoses:  Torticollis, acute   I personally performed the services described in this documentation, which was scribed in my presence. The recorded information has been reviewed and is accurate.      [place], [place] 10/18/14 2303  Tilden Fossa, MD 10/19/14 1520

## 2014-10-18 NOTE — ED Notes (Signed)
Pt verbalized understanding of d/c instructions and has no further questions. Pt stable and NAD. Pt discharged home with family driving. 

## 2014-10-18 NOTE — ED Notes (Signed)
Warm blanket applied around pt's neck.

## 2014-10-18 NOTE — ED Notes (Signed)
Pt states "My neck feels better since y'all put the warm blanket around my neck"

## 2014-10-18 NOTE — ED Notes (Signed)
Per [facility], pt met them at the door for left neck pain. Thinks she slept on it wrong. Hurts to turn head to the right. Able to move to the left a little. Thinks it is a "crik" in the neck.

## 2014-10-18 NOTE — Discharge Instructions (Signed)
The muscle relaxant could make you sleepy so be careful when taking the medication.

## 2014-10-18 NOTE — ED Notes (Signed)
PA in room

## 2017-03-14 ENCOUNTER — Emergency Department (HOSPITAL_COMMUNITY)
Admission: EM | Admit: 2017-03-14 | Discharge: 2017-03-14 | Disposition: A | Discharge status: Home / Self Care | Payer: Medicare HMO | Attending: Emergency Medicine | Admitting: Emergency Medicine

## 2017-03-14 ENCOUNTER — Encounter (HOSPITAL_COMMUNITY): Payer: Self-pay

## 2017-03-14 ENCOUNTER — Emergency Department (HOSPITAL_COMMUNITY): Payer: Medicare HMO

## 2017-03-14 ENCOUNTER — Other Ambulatory Visit: Payer: Self-pay

## 2017-03-14 DIAGNOSIS — Z951 Presence of aortocoronary bypass graft: Secondary | ICD-10-CM | POA: Insufficient documentation

## 2017-03-14 DIAGNOSIS — M542 Cervicalgia: Secondary | ICD-10-CM | POA: Diagnosis not present

## 2017-03-14 DIAGNOSIS — F17210 Nicotine dependence, cigarettes, uncomplicated: Secondary | ICD-10-CM | POA: Diagnosis not present

## 2017-03-14 DIAGNOSIS — Z79899 Other long term (current) drug therapy: Secondary | ICD-10-CM | POA: Diagnosis not present

## 2017-03-14 DIAGNOSIS — I10 Essential (primary) hypertension: Secondary | ICD-10-CM | POA: Diagnosis not present

## 2017-03-14 DIAGNOSIS — I2510 Atherosclerotic heart disease of native coronary artery without angina pectoris: Secondary | ICD-10-CM | POA: Insufficient documentation

## 2017-03-14 DIAGNOSIS — S199XXA Unspecified injury of neck, initial encounter: Secondary | ICD-10-CM | POA: Diagnosis not present

## 2017-03-14 DIAGNOSIS — S0990XA Unspecified injury of head, initial encounter: Secondary | ICD-10-CM | POA: Diagnosis not present

## 2017-03-14 DIAGNOSIS — Z7982 Long term (current) use of aspirin: Secondary | ICD-10-CM | POA: Diagnosis not present

## 2017-03-14 DIAGNOSIS — Z9114 Patient's other noncompliance with medication regimen: Secondary | ICD-10-CM | POA: Insufficient documentation

## 2017-03-14 DIAGNOSIS — R42 Dizziness and giddiness: Secondary | ICD-10-CM | POA: Diagnosis not present

## 2017-03-14 LAB — CBC WITH DIFFERENTIAL/PLATELET
BASOS ABS: 0.1 10*3/uL (ref 0.0–0.1)
Basophils Relative: 1 %
EOS ABS: 0.2 10*3/uL (ref 0.0–0.7)
EOS PCT: 3 %
HCT: 41.9 % (ref 36.0–46.0)
Hemoglobin: 14.7 g/dL (ref 12.0–15.0)
LYMPHS PCT: 46 %
Lymphs Abs: 2.4 10*3/uL (ref 0.7–4.0)
MCH: 33.3 pg (ref 26.0–34.0)
MCHC: 35.1 g/dL (ref 30.0–36.0)
MCV: 94.8 fL (ref 78.0–100.0)
Monocytes Absolute: 0.4 10*3/uL (ref 0.1–1.0)
Monocytes Relative: 8 %
Neutro Abs: 2.2 10*3/uL (ref 1.7–7.7)
Neutrophils Relative %: 42 %
PLATELETS: 223 10*3/uL (ref 150–400)
RBC: 4.42 MIL/uL (ref 3.87–5.11)
RDW: 13.9 % (ref 11.5–15.5)
WBC: 5.2 10*3/uL (ref 4.0–10.5)

## 2017-03-14 LAB — TROPONIN I

## 2017-03-14 LAB — BASIC METABOLIC PANEL
Anion gap: 15 (ref 5–15)
BUN: 18 mg/dL (ref 6–20)
CHLORIDE: 102 mmol/L (ref 101–111)
CO2: 20 mmol/L — AB (ref 22–32)
CREATININE: 1.16 mg/dL — AB (ref 0.44–1.00)
Calcium: 10 mg/dL (ref 8.9–10.3)
GFR calc Af Amer: 54 mL/min — ABNORMAL LOW (ref >60)
GFR calc non Af Amer: 47 mL/min — ABNORMAL LOW (ref >60)
Glucose, Bld: 71 mg/dL (ref 65–99)
POTASSIUM: 4.1 mmol/L (ref 3.5–5.1)
SODIUM: 137 mmol/L (ref 135–145)

## 2017-03-14 MED ORDER — LISINOPRIL 5 MG PO TABS: 5.0000 mg | ORAL_TABLET | Freq: Every day | ORAL | 0 refills | Status: DC

## 2017-03-14 MED ORDER — LISINOPRIL 10 MG PO TABS
5.0000 mg | ORAL_TABLET | Freq: Once | ORAL | Status: AC
  Administered 2017-03-14: 5 mg via ORAL
  Filled 2017-03-14: qty 1

## 2017-03-14 NOTE — Discharge Instructions (Signed)
Call the number on these discharge instructions tomorrow to get a new primary care physician to follow if your blood pressure and other health issues.  Call for the next available appointment..  Make sure that you take your lisinopril as prescribed.  Ask your new primary care physician to help you to stop smoking

## 2017-03-14 NOTE — ED Triage Notes (Addendum)
Per Pt, Pt is coming from home with dizziness about a week ago. Pt reports that she had a fall after the snow happened and she fell forward and hit her face. Denies LOC. Denies any CP and SOB, N/V   Reports being out of her Lisinopril, and started to have neck pain approximately a couple days after the fall on the left side.

## 2017-03-14 NOTE — ED Provider Notes (Addendum)
[facility] EMERGENCY DEPARTMENT Provider Note   CSN: [id number] Arrival date & time: 03/14/17  1645     History   Chief Complaint Chief Complaint  Patient presents with  . Dizziness  . Fall    HPI [name] Todd is a 72 y.o. female.   complains of dizziness meaning lightheadedness onset 1 week ago.  She reports that 1 week ago she fell while standing injuring her upper lip, mucosal surface.  Since the fall she complains of neck pain which is worse with rotating her neck no pain when holding her neck still.  She denies loss of consciousness.  She denies chest pain or shortness of breath.  She been noncompliant with lisinopril for 1 week states that she ran out.  She reports that she feels "fuzzy in the head" no other associated symptoms no treatment prior to coming here  HPI  Past Medical History:  Diagnosis Date  . Breast mass   . Dysmenorrhea   . Endometriosis   . Estrogen deficiency   . Hypertension   . Increased blood pressure (not hypertension)     Patient Active Problem List   Diagnosis Date Noted  . CAD - RCA PCI followed by urgent CABG (Nl LVF) 09/26/2012  . S/P CABG x 4- urgent- 09/24/12 09/26/2012  . RBBB (present pre-op) 09/26/2012  . Chest pain 09/24/2012  . ST elevation myocardial infarction (STEMI) of inferior wall (HCC) 09/24/2012  . PAF (paroxysmal atrial fibrillation)- peri op 09/24/2012  . Hypertension 09/24/2012  . Tobacco abuse 09/24/2012  . Dysmenorrhea   . Breast mass   . Estrogen deficiency   . Increased blood pressure (not hypertension)   . Endometriosis     Past Surgical History:  Procedure Laterality Date  . ABDOMINAL HYSTERECTOMY    . BACK SURGERY    . CORONARY ARTERY BYPASS GRAFT N/A 09/24/2012   Procedure: CORONARY ARTERY BYPASS GRAFTING (CABG);  Surgeon: Alleen Borne, MD;  Location: [facility] OR;  Service: Open Heart Surgery;  Laterality: N/A;  . ENDOVEIN HARVEST OF GREATER SAPHENOUS VEIN Right 09/24/2012   Procedure: ENDOVEIN  HARVEST OF GREATER SAPHENOUS VEIN;  Surgeon: Alleen Borne, MD;  Location: [facility] OR;  Service: Open Heart Surgery;  Laterality: Right;  . TEE WITHOUT CARDIOVERSION N/A 09/24/2012   Procedure: TRANSESOPHAGEAL ECHOCARDIOGRAM (TEE);  Surgeon: Alleen Borne, MD;  Location: [facility] OR;  Service: Open Heart Surgery;  Laterality: N/A;    OB History    Gravida Para Term Preterm AB Living   0 0 0 0 0 0   SAB TAB Ectopic Multiple Live Births   0 0 0 0         Home Medications    Prior to Admission medications   Medication Sig Start Date End Date Taking? Authorizing Provider  aspirin EC 325 MG EC tablet Take 1 tablet (325 mg total) by mouth daily. 09/28/12   Collins, Debby Freiberg, PA-C  atorvastatin (LIPITOR) 80 MG tablet Take 1 tablet (80 mg total) by mouth daily after supper. 09/28/12   Collins, Debby Freiberg, PA-C  cyclobenzaprine (FLEXERIL) 5 MG tablet Take 1 tablet (5 mg total) by mouth 2 (two) times daily as needed for muscle spasms. 10/18/14   Janne Napoleon, NP  Estradiol 0.25 MG/0.25GM GEL Place 1 application onto the skin daily.    [provider]  HYDROcodone-acetaminophen (NORCO) 7.5-325 MG per tablet Take 1 tablet by mouth every 4 (four) hours as needed for pain. 11/20/12   Evelene Croon  K, MD  lisinopril (PRINIVIL,ZESTRIL) 5 MG tablet Take 1 tablet (5 mg total) by mouth daily. 09/28/12   Coral Ceoollins, Gina L, PA-C  metoprolol tartrate (LOPRESSOR) 25 MG tablet Take 1 tablet (25 mg total) by mouth 2 (two) times daily. 09/28/12   Wilmon Paliollins, Gina L, PA-C    Family History Family History  Problem Relation Age of Onset  . Diabetes Maternal Grandmother   . Coronary artery disease Sister     Social History Social History   Tobacco Use  . Smoking status: Current Every Day Smoker    Packs/day: 0.50    Types: Cigarettes  . Smokeless tobacco: Never Used  Substance Use Topics  . Alcohol use: No  . Drug use: No     Allergies   Patient has no known allergies.   Review of Systems Review of Systems    Constitutional: Negative.   HENT: Negative.   Respiratory: Negative.   Cardiovascular: Negative.   Gastrointestinal: Negative.   Musculoskeletal: Positive for neck pain.  Skin: Negative.   Neurological: Positive for dizziness.  Psychiatric/Behavioral: Negative.      Physical Exam Updated Vital Signs BP (!) 223/116 (BP Location: Right Arm)   Pulse (!) 112   Temp 97.8 F (36.6 C) (Oral)   Resp 18   Ht 5\' 4"  (1.626 m)   Wt 59 kg (130 lb)   SpO2 98%   BMI 22.31 kg/m   Physical Exam  Constitutional: She is oriented to person, place, and time. She appears well-developed and well-nourished. No distress.  HENT:  Head: Normocephalic and atraumatic.  Eyes: Conjunctivae are normal. Pupils are equal, round, and reactive to light.  Neck: Neck supple. No tracheal deviation present. No thyromegaly present.  Cardiovascular: Normal rate and regular rhythm.  No murmur heard. Mild tachycardic  Pulmonary/Chest: Effort normal and breath sounds normal.  Abdominal: Soft. Bowel sounds are normal. She exhibits no distension. There is no tenderness.  Musculoskeletal: Normal range of motion. She exhibits no edema or tenderness.  Neurological: She is alert and oriented to person, place, and time. Coordination normal.  Normal Romberg normal pronator drift normal finger to nose normal Glasgow Coma Score 15  Skin: Skin is warm and dry. No rash noted.  Psychiatric: She has a normal mood and affect.  Nursing note and vitals reviewed.    ED Treatments / Results  Labs (all labs ordered are listed, but only abnormal results are displayed) Labs Reviewed  BASIC METABOLIC PANEL  CBC WITH DIFFERENTIAL/PLATELET  TROPONIN I    EKG  EKG Interpretation None       Radiology No results found.  Procedures Procedures (including critical care time)  Medications Ordered in ED Medications - No data to display   Initial Impression / Assessment and Plan / ED Course  I have reviewed the triage  vital signs and the nursing notes.  Pertinent labs & imaging results that were available during my care of the patient were reviewed by me and considered in my medical decision making (see chart for details).     She is asymptomatic after treatment with lisinopril and blood pressure has normalized.  Plan prescription lisinopril I Counseled patient for 5 minutes on smoking cessation.  Plan referral primary care as her prior PCP has  retired. No signs of endorgan damage Final Clinical Impressions(s) / ED Diagnoses   diagnosis #1 hypertension #2 medication noncompliance #3 tobacco abuse Final diagnoses:  None   #4 fall ED Discharge Orders    None  Doug Sou, MD 03/14/17 2037    Doug Sou, MD 03/14/17 2039

## 2017-03-14 NOTE — ED Notes (Signed)
Patient given discharge instructions and verbalized understanding.  Patient stable to discharge at this time.  Patient is alert and oriented to baseline.  No distressed noted at this time.  All belongings taken with the patient at discharge.   

## 2017-03-14 NOTE — ED Notes (Signed)
Md Jacubowitz at the bedside

## 2017-04-25 ENCOUNTER — Encounter (INDEPENDENT_AMBULATORY_CARE_PROVIDER_SITE_OTHER): Payer: Self-pay

## 2017-04-25 ENCOUNTER — Encounter: Payer: Self-pay | Admitting: Internal Medicine

## 2017-04-25 ENCOUNTER — Ambulatory Visit (INDEPENDENT_AMBULATORY_CARE_PROVIDER_SITE_OTHER): Payer: Medicare HMO | Admitting: Internal Medicine

## 2017-04-25 ENCOUNTER — Other Ambulatory Visit: Payer: Self-pay

## 2017-04-25 VITALS — BP 194/112 | HR 81 | Ht 64.0 in | Wt 116.3 lb

## 2017-04-25 DIAGNOSIS — Z955 Presence of coronary angioplasty implant and graft: Secondary | ICD-10-CM

## 2017-04-25 DIAGNOSIS — I25810 Atherosclerosis of coronary artery bypass graft(s) without angina pectoris: Secondary | ICD-10-CM

## 2017-04-25 DIAGNOSIS — G3184 Mild cognitive impairment, so stated: Secondary | ICD-10-CM

## 2017-04-25 DIAGNOSIS — I2510 Atherosclerotic heart disease of native coronary artery without angina pectoris: Secondary | ICD-10-CM | POA: Diagnosis not present

## 2017-04-25 DIAGNOSIS — Z951 Presence of aortocoronary bypass graft: Secondary | ICD-10-CM

## 2017-04-25 DIAGNOSIS — E041 Nontoxic single thyroid nodule: Secondary | ICD-10-CM

## 2017-04-25 DIAGNOSIS — R4189 Other symptoms and signs involving cognitive functions and awareness: Secondary | ICD-10-CM | POA: Insufficient documentation

## 2017-04-25 DIAGNOSIS — Z720 Tobacco use: Secondary | ICD-10-CM

## 2017-04-25 DIAGNOSIS — R413 Other amnesia: Secondary | ICD-10-CM | POA: Diagnosis not present

## 2017-04-25 DIAGNOSIS — I10 Essential (primary) hypertension: Secondary | ICD-10-CM | POA: Diagnosis not present

## 2017-04-25 DIAGNOSIS — F419 Anxiety disorder, unspecified: Secondary | ICD-10-CM

## 2017-04-25 DIAGNOSIS — I252 Old myocardial infarction: Secondary | ICD-10-CM

## 2017-04-25 DIAGNOSIS — F17210 Nicotine dependence, cigarettes, uncomplicated: Secondary | ICD-10-CM

## 2017-04-25 DIAGNOSIS — Z7982 Long term (current) use of aspirin: Secondary | ICD-10-CM

## 2017-04-25 DIAGNOSIS — R450 Nervousness: Secondary | ICD-10-CM

## 2017-04-25 DIAGNOSIS — Z8673 Personal history of transient ischemic attack (TIA), and cerebral infarction without residual deficits: Secondary | ICD-10-CM | POA: Insufficient documentation

## 2017-04-25 DIAGNOSIS — Z79899 Other long term (current) drug therapy: Secondary | ICD-10-CM

## 2017-04-25 DIAGNOSIS — Z0000 Encounter for general adult medical examination without abnormal findings: Secondary | ICD-10-CM

## 2017-04-25 MED ORDER — ASPIRIN EC 81 MG PO TBEC: 81.0000 mg | DELAYED_RELEASE_TABLET | Freq: Every day | ORAL | 2 refills | Status: DC

## 2017-04-25 MED ORDER — LISINOPRIL 20 MG PO TABS: 20.0000 mg | ORAL_TABLET | Freq: Every day | ORAL | 2 refills | Status: DC

## 2017-04-25 MED ORDER — ATORVASTATIN CALCIUM 40 MG PO TABS: 80.0000 mg | ORAL_TABLET | Freq: Every day | ORAL | 2 refills | Status: DC

## 2017-04-25 MED ORDER — CARVEDILOL 3.125 MG PO TABS: 3.1250 mg | ORAL_TABLET | Freq: Two times a day (BID) | ORAL | 2 refills | Status: DC

## 2017-04-25 NOTE — Assessment & Plan Note (Signed)
Due to her inability to recall major life event such as CABG, there was concern for cognitive impairment that had been unrecognized.  She scored a 24/30 on MMSE indicating mild cognitive impairment.  She attributes much of this to her BP being severely elevated.  She scored a 0 on her PHQ screening and a 2 on her GAD7 questioning.    - TSH, B12, RPR, HIV - Follow her MMSE in the future for any improvement or deterioration as this may require some supervision, support and assistance as patient lives alone.

## 2017-04-25 NOTE — Assessment & Plan Note (Signed)
She continues to smoke about 1/2 PPD for at least 50 years.  She is not interested in quitting at this time.  - Continue to address at follow up visits - She denies any COPD symptoms currently but may benefit from PFTs in the future

## 2017-04-25 NOTE — Assessment & Plan Note (Signed)
She has history of STEMI s/p PCI and CABG in August 2014.  When asked about this today, she has no recollection of having had heart surgery and says her MI was "just a little one".  She has not been taking ASA, statin, or beta blocker for a long time.  Her only medication currently is lisinopril that she has not taken for greater than 1 week.  She denies any exertional or resting anginal symptoms and denies any shortness of breath.  - Lisinopril 20mg  daily - Resume beta blocker with Carvedilol 3.125mg  BID and titrate up as needed - Resume atorvastatin 40mg  daily for high intensity statin benefit - Resume ASA 81mg  daily

## 2017-04-25 NOTE — Assessment & Plan Note (Addendum)
She has an old left frontal lobe infarct seen on CT head in January 2019.  She is currently not taking anything for secondary prevention.  She is seemingly without residual deficit from this event.  - ASA, statin, BP control - encourage smoking cessation

## 2017-04-25 NOTE — Assessment & Plan Note (Signed)
She has a 15mm incidental thyroid nodule noted on CT scan of head and neck that was obtained through the emergency department in January 2019.  She exhibits no symptoms of hyperthyroidism today.  - TSH ordered. - She will need follow up thyroid ultrasound to better evaluate this once she is set up with a new primary provider.

## 2017-04-25 NOTE — Progress Notes (Addendum)
CC: here to establish care, follow up for HTN, CAD  HPI:  [name] is a 72 y.o. woman with a past medical history listed below here today for follow up of her CAD and HTN.  For details of today's visit and the status of her chronic medical issues please refer to the assessment and plan.   Past Medical History:  Diagnosis Date  . Breast mass   . Cognitive impairment   . Coronary artery disease    s/p MI and CABG in 2014  . Dysmenorrhea   . Endometriosis   . Estrogen deficiency   . Hypertension   . Stroke [facility])    old left frontal infarct seen on CT scan 02/2017  . Tobacco abuse    Review of Systems:  Review of Systems  Constitutional: Negative.   Eyes: Negative for blurred vision.  Respiratory: Negative for cough, sputum production and shortness of breath.   Cardiovascular: Negative for chest pain, orthopnea and leg swelling.  Neurological: Negative for dizziness and headaches.  Psychiatric/Behavioral: Positive for memory loss. Negative for depression. The patient is nervous/anxious. The patient does not have insomnia.      Physical Exam:  Vitals:   04/25/17 0940  BP: (!) 194/112  Pulse: 81  SpO2: 100%  Weight: 116 lb 4.8 oz (52.8 kg)  Height: 5\' 4"  (1.626 m)   Physical Exam  Constitutional: She is oriented to person, place, and time and well-developed, well-nourished, and in no distress.  HENT:  Head: Normocephalic and atraumatic.  Cardiovascular: Normal rate, regular rhythm and normal heart sounds.  Pulmonary/Chest: Effort normal and breath sounds normal. She has no wheezes. She has no rales.  Neurological: She is alert and oriented to person, place, and time. No cranial nerve deficit. Gait normal.  Skin: Skin is warm and dry.  She has a chest wall scar indicating prior CABG  Psychiatric: Mood and affect normal.    Assessment & Plan:   See Encounters Tab for problem based charting.  Patient discussed with Dr. Josem Kaufmann .  Thyroid nodule She has a  15mm incidental thyroid nodule noted on CT scan of head and neck that was obtained through the emergency department in January 2019.  She exhibits no symptoms of hyperthyroidism today.  - TSH ordered. - She will need follow up thyroid ultrasound to better evaluate this once she is set up with a new primary provider.  Hypertension Her blood pressure is severely elevated today as it was when she presented to the ED in January 2019.  She has an empty bottle of lisinopril 20mg  daily that she has not taken for at least 1 week and possibly as long as 1 month as she is unsure.  She reports this is the only medication that she is to be on.  She is otherwise asymptomatic although does attribute her poor memory to her BP being elevated.  - Resume lisinopril 20mg  daily - Restart carvedilol 3.125mg  BID for BP and CAD benefit - CMET today to assess for kidney dysfunction - RTC 2 weeks to recheck blood pressure  CAD - RCA PCI followed by urgent CABG (Nl LVF) She has history of STEMI s/p PCI and CABG in August 2014.  When asked about this today, she has no recollection of having had heart surgery and says her MI was "just a little one".  She has not been taking ASA, statin, or beta blocker for a long time.  Her only medication currently is lisinopril that she has not  taken for greater than 1 week.  She denies any exertional or resting anginal symptoms and denies any shortness of breath.  - Lisinopril 20mg  daily - Resume beta blocker with Carvedilol 3.125mg  BID and titrate up as needed - Resume atorvastatin 40mg  daily for high intensity statin benefit - Resume ASA 81mg  daily  Tobacco abuse She continues to smoke about 1/2 PPD for at least 50 years.  She is not interested in quitting at this time.  - Continue to address at follow up visits - She denies any COPD symptoms currently but may benefit from PFTs in the future  History of CVA (cerebrovascular accident) She has an old left frontal lobe infarct  seen on CT head in January 2019.  She is currently not taking anything for secondary prevention.  She is seemingly without residual deficit from this event.  - ASA, statin, BP control - encourage smoking cessation  Cognitive impairment Due to her inability to recall major life event such as CABG, there was concern for cognitive impairment that had been unrecognized.  She scored a 24/30 on MMSE indicating mild cognitive impairment.  She attributes much of this to her BP being severely elevated.  She scored a 0 on her PHQ screening and a 2 on her GAD7 questioning.    - TSH, B12, RPR, HIV - Follow her MMSE in the future for any improvement or deterioration as this may require some supervision, support and assistance as patient lives alone.  Healthcare maintenance She is overdue for much of her healthcare maintenance.  - We will check an HIV and HCV antibody today and defer further screening to her new PCP.  Anxiety Towards conclusion of visit, she inquired about getting something for her nerves and reports that Valium has worked well in the past.  She scored a 0 on her PHQ-2 screening and when we did a GAD-7 questionairre she scored a 2.    - I expressed to her that she should discuss further with her new PCP regarding anxiety medication but that starting her on benzodiazepine therapy at her initial visit would not be appropriate especially given her low scores on questioning.

## 2017-04-25 NOTE — Assessment & Plan Note (Signed)
Her blood pressure is severely elevated today as it was when she presented to the ED in January 2019.  She has an empty bottle of lisinopril 20mg  daily that she has not taken for at least 1 week and possibly as long as 1 month as she is unsure.  She reports this is the only medication that she is to be on.  She is otherwise asymptomatic although does attribute her poor memory to her BP being elevated.  - Resume lisinopril 20mg  daily - Restart carvedilol 3.125mg  BID for BP and CAD benefit - CMET today to assess for kidney dysfunction - RTC 2 weeks to recheck blood pressure

## 2017-04-25 NOTE — Assessment & Plan Note (Signed)
Towards conclusion of visit, she inquired about getting something for her nerves and reports that Valium has worked well in the past.  She scored a 0 on her PHQ-2 screening and when we did a GAD-7 questionairre she scored a 2.    - I expressed to her that she should discuss further with her new PCP regarding anxiety medication but that starting her on benzodiazepine therapy at her initial visit would not be appropriate especially given her low scores on questioning.

## 2017-04-25 NOTE — Assessment & Plan Note (Signed)
She is overdue for much of her healthcare maintenance.  - We will check an HIV and HCV antibody today and defer further screening to her new PCP.

## 2017-04-25 NOTE — Patient Instructions (Signed)
Thank you for coming to see me today. It was a pleasure. Today we talked about:   Blood pressure and heart disease: - I have sent in several prescriptions to your pharmacy:  1. Lisinopril 20mg  daily for blood pressure  2. Carvedilol 3.25mg  twice daily for blood pressure  3. Aspirin 81mg  daily to prevent heart attack and stroke  4. Atorvastatin 40mg  daily to prevent heart attack  We are checking some lab work today to make sure the kidney's are working and that your memory problems are not related to anything else.  We will call you with any abnormal results.  Please follow-up with us in 2 weeks to recheck your blood pressure and meet your new doctor.  If you have any questions or concerns, please do not hesitate to call the office at [phone].  Take Care,   Gwynn BurlyAndrew Dezyrae Kensinger, DO

## 2017-04-26 ENCOUNTER — Other Ambulatory Visit: Payer: Self-pay | Admitting: Internal Medicine

## 2017-04-26 LAB — CMP14 + ANION GAP
A/G RATIO: 1.5 (ref 1.2–2.2)
ALK PHOS: 61 IU/L (ref 39–117)
ALT: 13 IU/L (ref 0–32)
AST: 23 IU/L (ref 0–40)
Albumin: 4.5 g/dL (ref 3.5–4.8)
Anion Gap: 17 mmol/L (ref 10.0–18.0)
BILIRUBIN TOTAL: 0.3 mg/dL (ref 0.0–1.2)
BUN/Creatinine Ratio: 10 — ABNORMAL LOW (ref 12–28)
BUN: 11 mg/dL (ref 8–27)
CHLORIDE: 101 mmol/L (ref 96–106)
CO2: 22 mmol/L (ref 20–29)
Calcium: 10.1 mg/dL (ref 8.7–10.3)
Creatinine, Ser: 1.08 mg/dL — ABNORMAL HIGH (ref 0.57–1.00)
GFR calc Af Amer: 60 mL/min/{1.73_m2} (ref >59)
GFR calc non Af Amer: 52 mL/min/{1.73_m2} — ABNORMAL LOW (ref >59)
GLUCOSE: 87 mg/dL (ref 65–99)
Globulin, Total: 3.1 g/dL (ref 1.5–4.5)
POTASSIUM: 4.3 mmol/L (ref 3.5–5.2)
Sodium: 140 mmol/L (ref 134–144)
Total Protein: 7.6 g/dL (ref 6.0–8.5)

## 2017-04-26 LAB — VITAMIN B12: VITAMIN B 12: 780 pg/mL (ref 232–1245)

## 2017-04-26 LAB — TSH: TSH: 1.32 u[IU]/mL (ref 0.450–4.500)

## 2017-04-26 LAB — RPR: RPR: NONREACTIVE

## 2017-04-26 LAB — HEPATITIS C ANTIBODY

## 2017-04-26 LAB — HIV ANTIBODY (ROUTINE TESTING W REFLEX): HIV SCREEN 4TH GENERATION: NONREACTIVE

## 2017-04-26 MED ORDER — ATORVASTATIN CALCIUM 40 MG PO TABS: 40.0000 mg | ORAL_TABLET | Freq: Every day | ORAL | 2 refills | Status: DC

## 2017-05-01 ENCOUNTER — Ambulatory Visit: Payer: Medicare HMO

## 2017-05-02 NOTE — Progress Notes (Signed)
Case discussed with Dr. Wallace at the time of the visit.  We reviewed the resident's history and exam and pertinent patient test results.  I agree with the assessment, diagnosis and plan of care documented in the resident's note. 

## 2017-05-09 ENCOUNTER — Encounter: Payer: Self-pay | Admitting: Internal Medicine

## 2017-05-09 ENCOUNTER — Ambulatory Visit: Payer: Medicare HMO

## 2017-06-06 ENCOUNTER — Other Ambulatory Visit: Payer: Self-pay | Admitting: *Deleted

## 2017-06-07 MED ORDER — LISINOPRIL 20 MG PO TABS: 20.0000 mg | ORAL_TABLET | Freq: Every day | ORAL | 2 refills | Status: DC

## 2017-06-07 MED ORDER — ATORVASTATIN CALCIUM 40 MG PO TABS: 40.0000 mg | ORAL_TABLET | Freq: Every day | ORAL | 1 refills | Status: DC

## 2017-06-07 MED ORDER — CARVEDILOL 3.125 MG PO TABS: 3.1250 mg | ORAL_TABLET | Freq: Two times a day (BID) | ORAL | 2 refills | Status: DC

## 2017-12-18 ENCOUNTER — Other Ambulatory Visit: Payer: Self-pay | Admitting: Internal Medicine

## 2017-12-18 MED ORDER — CARVEDILOL 3.125 MG PO TABS: 3.1250 mg | ORAL_TABLET | Freq: Two times a day (BID) | ORAL | 0 refills | Status: DC

## 2017-12-18 MED ORDER — LISINOPRIL 20 MG PO TABS: 20.0000 mg | ORAL_TABLET | Freq: Every day | ORAL | 0 refills | Status: DC

## 2017-12-18 NOTE — Telephone Encounter (Signed)
Needs appt next 30 days

## 2018-02-02 ENCOUNTER — Encounter: Payer: Medicare HMO | Admitting: Internal Medicine

## 2018-02-19 ENCOUNTER — Other Ambulatory Visit: Payer: Self-pay | Admitting: Internal Medicine

## 2018-03-26 ENCOUNTER — Other Ambulatory Visit: Payer: Self-pay | Admitting: Internal Medicine

## 2018-03-27 NOTE — Telephone Encounter (Signed)
Pt was called to schedule an appt w/her PCP - no answer; unable to a message. I will send a message to front office to try to contact pt also.

## 2018-04-02 ENCOUNTER — Ambulatory Visit (INDEPENDENT_AMBULATORY_CARE_PROVIDER_SITE_OTHER): Payer: Medicare HMO | Admitting: Internal Medicine

## 2018-04-02 ENCOUNTER — Other Ambulatory Visit: Payer: Self-pay

## 2018-04-02 VITALS — BP 185/98 | HR 74 | Temp 97.5°F | Ht 64.0 in | Wt 113.9 lb

## 2018-04-02 DIAGNOSIS — E041 Nontoxic single thyroid nodule: Secondary | ICD-10-CM | POA: Diagnosis not present

## 2018-04-02 DIAGNOSIS — I10 Essential (primary) hypertension: Secondary | ICD-10-CM | POA: Diagnosis not present

## 2018-04-02 DIAGNOSIS — G3184 Mild cognitive impairment, so stated: Secondary | ICD-10-CM

## 2018-04-02 DIAGNOSIS — Z79899 Other long term (current) drug therapy: Secondary | ICD-10-CM | POA: Diagnosis not present

## 2018-04-02 DIAGNOSIS — R4189 Other symptoms and signs involving cognitive functions and awareness: Secondary | ICD-10-CM | POA: Diagnosis not present

## 2018-04-02 DIAGNOSIS — I25810 Atherosclerosis of coronary artery bypass graft(s) without angina pectoris: Secondary | ICD-10-CM

## 2018-04-02 DIAGNOSIS — Z951 Presence of aortocoronary bypass graft: Secondary | ICD-10-CM

## 2018-04-02 MED ORDER — CARVEDILOL 3.125 MG PO TABS: 3.1250 mg | ORAL_TABLET | Freq: Two times a day (BID) | ORAL | 2 refills | Status: DC

## 2018-04-02 MED ORDER — ASPIRIN EC 81 MG PO TBEC: 81.0000 mg | DELAYED_RELEASE_TABLET | Freq: Every day | ORAL | 2 refills | Status: DC

## 2018-04-02 MED ORDER — SODIUM CHLORIDE 0.9 % IV BOLUS: 1000.0000 mL | Freq: Once | INTRAVENOUS | Status: DC

## 2018-04-02 MED ORDER — ATORVASTATIN CALCIUM 40 MG PO TABS: ORAL_TABLET | ORAL | 2 refills | Status: DC

## 2018-04-02 NOTE — Progress Notes (Signed)
CC: Medication refill  HPI:  [name] is a 73 y.o. female with hypertension and CAD s/p CABG x4 who presents for medication refill.  Hypertension: Antihypertensive regiment should include lisinopril 20 mg daily and carvedilol 3.125 mg twice daily.  BP elevated to 185/98 on arrival to clinic today.  [name] says that she has been taking her lisinopril most days and last time that she can remember was yesterday morning.  She is unsure whether she took it this morning.  She was unaware that she needed to take her carvedilol stating that she did not realize it was for her blood pressure and heart.  CAD status post CABG x4: [name] was completely unaware that she had any heart issues including coronary artery disease and myocardial infarction requiring PCI and CABG.  She has not been taking aspirin, statin, or beta-blocker for several months.  Last clinic note in March 2019 also noted that she was unaware of her heart condition and was not taking her medications.  She does not believe that anyone has ever spoken to her about this in the past.  She denies chest pain, palpitations, shortness of breath, diaphoresis, dizziness.  Thyroid nodule: 15 mm incidental thyroid nodule noted on January 2018 CT scan of head and neck.  TSH in March was unremarkable.  She does not have any symptoms of hyperthyroidism today including tachycardia, palpitations, chest pain.  Past Medical History:  Diagnosis Date  . Breast mass   . Cognitive impairment   . Coronary artery disease    s/p MI and CABG in 2014  . Dysmenorrhea   . Endometriosis   . Estrogen deficiency   . Hypertension   . Stroke [facility])    old left frontal infarct seen on CT scan 02/2017  . Tobacco abuse    Review of Systems:   Review of Systems  Constitutional: Negative for chills, fever and weight loss.  Respiratory: Negative for cough and shortness of breath.   Cardiovascular: Negative for chest pain, palpitations and leg swelling.   Genitourinary: Negative for dysuria, frequency and hematuria.  Neurological: Negative for dizziness and headaches.  All other systems reviewed and are negative.   Physical Exam:  Vitals:   04/02/18 0911  BP: (!) 185/98  Pulse: 74  Temp: (!) 97.5 F (36.4 C)  TempSrc: Oral  SpO2: 100%  Weight: 113 lb 14.4 oz (51.7 kg)  Height: 5\' 4"  (1.626 m)   Physical Exam Vitals signs reviewed.  Constitutional:      Appearance: Normal appearance.  HENT:     Head: Normocephalic and atraumatic.  Cardiovascular:     Rate and Rhythm: Normal rate and regular rhythm.     Heart sounds: Normal heart sounds.  Pulmonary:     Effort: Pulmonary effort is normal.     Breath sounds: Normal breath sounds.  Musculoskeletal:     Right lower leg: No edema.     Left lower leg: No edema.  Skin:    General: Skin is warm and dry.  Neurological:     Mental Status: She is alert and oriented to person, place, and time. Mental status is at baseline.  Psychiatric:     Comments: Normal mood and behavior.  She is very forgetful and does not remember whether she took her medication this morning.  She also had no idea that she had heart issues in the past including an MI and CABG.  She had this exact same conversation with our clinic in March of  last year and at that time also did not realize that she had heart problems.     Assessment & Plan:   See Encounters Tab for problem based charting.  Patient discussed with Dr. Heide Spark

## 2018-04-02 NOTE — Assessment & Plan Note (Addendum)
Assessment: [name] Todd does not have any recollection of her previous MI status post CABG.  She also did not realize that she was was to be taking aspirin, statin, or beta-blocker.  The only medication she has been taking is lisinopril but she not take this morning.  Plan: 1.  Continue lisinopril 20 mg daily 2.  Restart carvedilol 3.125 mg twice daily 3.  Restart atorvastatin 40 mg daily 4.  Restart aspirin 81 mg daily

## 2018-04-02 NOTE — Assessment & Plan Note (Signed)
Assessment: Thyroid nodule noted on previous CT scan of the head and neck.  TSH in March 2019 within normal limits.  Will order a thyroid ultrasound to better evaluate this.  Plan: 1.  Ordered thyroid ultrasound

## 2018-04-02 NOTE — Patient Instructions (Addendum)
Your medications will include:  Atorvastatin 40 mg once a day Lisinopril 20 mg once a day Aspirin 81 mg once a day Carvedilol 3.125 mg twice a day

## 2018-04-02 NOTE — Assessment & Plan Note (Signed)
Assessment: I do believe that [name] Todd is suffering from some mild cognitive impairment given that she does not remember having an MI or undergoing heart surgery.  Additionally she cannot remember whether she took her medications this morning.  MMSE was performed in March 2019 and she scored a 24/30.  TSH, B12, RPR, and HIV were all negative.  [name] Todd did not wish to have a Moca performed today stating that she does not have time.  Please consider repeating this at next visit.  Lan: 1.  Perform MOCA at next clinic visit

## 2018-04-02 NOTE — Assessment & Plan Note (Addendum)
Assessment: Blood pressure elevated on arrival to clinic.  [name] Todd states that he is unsure whether she took her lisinopril this morning.  She does state that the last time she remembers taking it was yesterday morning.  I do not believe that she has been taking her medication appropriately.  I encouraged her to take it every single day and we will have her follow-up in 2 weeks for blood pressure check.  Plan: 1.  Continue lisinopril 20 mg daily 2.  Restart carvedilol 3.125 mg twice daily

## 2018-04-04 NOTE — Progress Notes (Signed)
Internal Medicine Clinic Attending  Case discussed with Dr. Prince at the time of the visit.  We reviewed the resident's history and exam and pertinent patient test results.  I agree with the assessment, diagnosis, and plan of care documented in the resident's note.   

## 2018-04-16 ENCOUNTER — Encounter: Payer: Self-pay | Admitting: Internal Medicine

## 2018-04-16 ENCOUNTER — Telehealth: Payer: Self-pay | Admitting: *Deleted

## 2018-04-16 ENCOUNTER — Ambulatory Visit (INDEPENDENT_AMBULATORY_CARE_PROVIDER_SITE_OTHER): Payer: Medicare HMO | Admitting: Internal Medicine

## 2018-04-16 VITALS — BP 188/103 | HR 69 | Wt 114.6 lb

## 2018-04-16 DIAGNOSIS — R4189 Other symptoms and signs involving cognitive functions and awareness: Secondary | ICD-10-CM | POA: Diagnosis not present

## 2018-04-16 DIAGNOSIS — N182 Chronic kidney disease, stage 2 (mild): Secondary | ICD-10-CM

## 2018-04-16 DIAGNOSIS — I129 Hypertensive chronic kidney disease with stage 1 through stage 4 chronic kidney disease, or unspecified chronic kidney disease: Secondary | ICD-10-CM

## 2018-04-16 DIAGNOSIS — Z79899 Other long term (current) drug therapy: Secondary | ICD-10-CM

## 2018-04-16 DIAGNOSIS — I10 Essential (primary) hypertension: Secondary | ICD-10-CM

## 2018-04-16 MED ORDER — ASPIRIN EC 81 MG PO TBEC: 81.0000 mg | DELAYED_RELEASE_TABLET | Freq: Every day | ORAL | 2 refills | Status: DC

## 2018-04-16 MED ORDER — LISINOPRIL 40 MG PO TABS: 40.0000 mg | ORAL_TABLET | Freq: Every day | ORAL | 1 refills | Status: DC

## 2018-04-16 MED ORDER — CARVEDILOL 3.125 MG PO TABS: 3.1250 mg | ORAL_TABLET | Freq: Two times a day (BID) | ORAL | 2 refills | Status: DC

## 2018-04-16 MED ORDER — ATORVASTATIN CALCIUM 40 MG PO TABS: ORAL_TABLET | ORAL | 2 refills | Status: DC

## 2018-04-16 NOTE — Patient Instructions (Addendum)
Ms. Wildberger,   Your blood pressure is still very high. We will increase the dose of the lisinopril to 40 mg. This means you will take 2 tablets of the lisinopril that you have at home.  I will send all of your prescriptions to [facility] mailing [facility]  They will send you your medications in a pill pack.   Please make a follow-up appointment in 4 weeks to recheck your blood pressure.  Call us if you have any questions or concerns.  -Dr. Evelene Croon

## 2018-04-16 NOTE — Assessment & Plan Note (Addendum)
Ms. Dini was seen 3 weeks ago in [facility] for uncontrolled hypertension.  At that time she was on lisinopril 20 mg daily and carvedilol 3.125 mg twice daily, but was not aware she was supposed to be taking Coreg. This was restarted at that visit. She presents today for follow up and reports compliance with lisinopril 20, but has been taking Coreg 6.25 mg BID. Her BP is 170-180s/100s and pulse is 60. She is not sure if she took her medications this morning. There has been documented concern about underlying dementia possibly affecting medication adherence. Her MMSE was 24/30. She has declined MOCA tests in the past as well as today.   We will increase lisinopril dose 20-> 40 mg QD. She does have mild renal insufficiency and we will have her follow up in 4 weeks for BP recheck and BMP. I also arranged a pill-pack and delivery service for her medications to help with adherence. If she remains hypertensive at follow up visit, would recommend adding amlodipine.

## 2018-04-16 NOTE — Telephone Encounter (Signed)
Pharmacy calls and states pt came in for meds and seems like she is not "getting it" seemed a little confused. They will put in pill packs but suggest HHN for medication and disease management. Could you do a referral for HHN and HH CSW for eval and treat?

## 2018-04-16 NOTE — Progress Notes (Signed)
   CC: HTN follow up  HPI:  [name] is a 73 y.o. year-old female with PMH listed below who presents to clinic for HTN follow up. Please see problem based assessment and plan for further details.   Past Medical History:  Diagnosis Date  . Breast mass   . Cognitive impairment   . Coronary artery disease    s/p MI and CABG in 2014  . Dysmenorrhea   . Endometriosis   . Estrogen deficiency   . Hypertension   . Stroke [facility])    old left frontal infarct seen on CT scan 02/2017  . Tobacco abuse    Review of Systems:   Review of Systems  Respiratory: Negative for shortness of breath.   Cardiovascular: Negative for chest pain, palpitations and leg swelling.  Neurological: Negative for headaches.    Physical Exam: Vitals:   04/16/18 0859 04/16/18 0915  BP: (!) 178/100 (!) 188/103  Pulse: 60 69  SpO2:  100%  Weight: 114 lb 9.6 oz (52 kg)    General: Well-appearing elderly female in no acute distress Cardiac: regular rate and rhythm, nl S1/S2, no murmurs, rubs or gallops  Pulm: CTAB, no wheezes or crackles, no increased work of breathing on room air   Assessment & Plan:   See Encounters Tab for problem based charting.  Patient discussed with Dr. Josem Kaufmann

## 2018-04-17 NOTE — Progress Notes (Signed)
Case discussed with Dr. Santos-Sanchez at the time of the visit.  We reviewed the resident's history and exam and pertinent patient test results.  I agree with the assessment, diagnosis and plan of care documented in the resident's note. 

## 2018-04-18 ENCOUNTER — Other Ambulatory Visit: Payer: Self-pay | Admitting: Internal Medicine

## 2018-04-18 DIAGNOSIS — R4189 Other symptoms and signs involving cognitive functions and awareness: Secondary | ICD-10-CM

## 2018-04-18 NOTE — Telephone Encounter (Signed)
Order placed for [facility] RN and CSW.

## 2018-04-19 NOTE — Addendum Note (Signed)
Addended by: Burna Cash on: 04/19/2018 08:45 AM   Modules accepted: Orders

## 2018-04-23 NOTE — Telephone Encounter (Signed)
Attempted to reach patient to discuss [facility] agency options for [facility] RN and SW. No answer and VM box not set up. Will make second attempt later. Kinnie Feil, RN, BSN

## 2018-04-26 NOTE — Telephone Encounter (Signed)
No answer at patient's number. Left detailed message on (Emergency Contact/Sister) Venita Sheffield' self-identified VM requesting return call to discuss [facility] options. Kinnie Feil, RN, BSN

## 2018-04-30 ENCOUNTER — Other Ambulatory Visit: Payer: Self-pay | Admitting: Internal Medicine

## 2018-05-02 ENCOUNTER — Other Ambulatory Visit: Payer: Self-pay | Admitting: *Deleted

## 2018-05-02 MED ORDER — ATORVASTATIN CALCIUM 40 MG PO TABS: ORAL_TABLET | ORAL | 2 refills | Status: DC

## 2018-05-02 NOTE — Telephone Encounter (Signed)
Please send rx to [facility].

## 2018-05-07 ENCOUNTER — Telehealth: Payer: Self-pay | Admitting: Internal Medicine

## 2018-05-07 NOTE — Telephone Encounter (Signed)
Patient returned call. Explained that order for [facility] RN and SW placed. Patient is adamant that she does not want these services. Discussed medications. Patient continues to seem confused. Patient is amenable to receiving call from Dr. Selena Batten to review meds. Discussed with Dr. Selena Batten who will contact patient tomorrow. Kinnie Feil, RN, BSN

## 2018-05-07 NOTE — Telephone Encounter (Signed)
Missed call pls contact [phone]

## 2018-05-07 NOTE — Telephone Encounter (Signed)
This has been addressed in phone encounter started on 04/16/2018. Kinnie Feil, RN, BSN

## 2018-05-07 NOTE — Telephone Encounter (Signed)
No answer at patient's number and no VM set up. Spoke with sister, Stanford Breed who is emergency contact. States they don't really communicate but gave another number to try. Called that number and left HIPAA compliant message requesting return call. Kinnie Feil, RN, BSN

## 2018-05-08 ENCOUNTER — Telehealth: Payer: Self-pay

## 2018-05-08 NOTE — Telephone Encounter (Signed)
Tried calling to do a med review, but no answer; pt doesn't have a VM set up.

## 2018-05-08 NOTE — Telephone Encounter (Signed)
Agree  Thank you

## 2018-05-10 ENCOUNTER — Encounter: Payer: Self-pay | Admitting: *Deleted

## 2018-05-14 ENCOUNTER — Ambulatory Visit: Payer: Medicare HMO

## 2018-07-11 ENCOUNTER — Ambulatory Visit (HOSPITAL_COMMUNITY): Payer: Medicare HMO

## 2018-08-01 ENCOUNTER — Other Ambulatory Visit: Payer: Self-pay | Admitting: Internal Medicine

## 2018-09-08 ENCOUNTER — Other Ambulatory Visit: Payer: Self-pay | Admitting: Internal Medicine

## 2018-11-14 ENCOUNTER — Other Ambulatory Visit: Payer: Self-pay | Admitting: Internal Medicine

## 2018-11-14 NOTE — Telephone Encounter (Signed)
Patient needs appt with me.

## 2018-11-19 ENCOUNTER — Telehealth: Payer: Self-pay | Admitting: *Deleted

## 2018-11-19 NOTE — Telephone Encounter (Signed)
Pt's mail order pharm called to ask about possible refill of lisinopril. They were given dr chundi's answer and told pt has appt in October. Pharm was agreeable

## 2018-11-21 ENCOUNTER — Other Ambulatory Visit: Payer: Self-pay | Admitting: *Deleted

## 2018-11-22 MED ORDER — LISINOPRIL 40 MG PO TABS: 40.0000 mg | ORAL_TABLET | Freq: Every day | ORAL | 1 refills | Status: DC

## 2018-12-10 NOTE — Progress Notes (Deleted)
   CC: ***  HPI:  [name] is a 73 y.o. with cad s/p cabg, hypertension, and cognitive impairment who presents for hypertension follow up. Please see problem based charting for evaluation, assessment, and plan.  htn  The patient's blood pressure during this visit was ***. The patient is currently taking lisinopril 40mg  qd and carvedilol 3.125mg  bid. His/her *** last blood pressure visits are  BP Readings from Last 3 Encounters:  04/16/18 (!) 188/103  04/02/18 (!) 185/98  04/25/17 (!) 194/112    The patient does/does not *** report palpitations, dizziness, chest pain, sob ***.  Assessment and Plan ***    Past Medical History:  Diagnosis Date  . Breast mass   . Cognitive impairment   . Coronary artery disease    s/p MI and CABG in 2014  . Dysmenorrhea   . Endometriosis   . Estrogen deficiency   . Hypertension   . Stroke [facility])    old left frontal infarct seen on CT scan 02/2017  . Tobacco abuse    Review of Systems:  ***  Physical Exam:  There were no vitals filed for this visit. ***  Assessment & Plan:   See Encounters Tab for problem based charting.  Patient {GC/GE:[id number]::"discussed with","seen with"} Dr. {NAMES:[id number]::"Butcher","Guilloud","Hoffman","Mullen","Narendra","Raines","Vincent"}

## 2018-12-11 ENCOUNTER — Encounter: Payer: Medicare HMO | Admitting: Internal Medicine

## 2018-12-11 NOTE — Progress Notes (Deleted)
Attempted to call patient, unable to reach. Voicemail has not been setup yet.   Lars Mage, MD Internal Medicine PGY3 Pager:[phone] 12/11/2018, 10:55 AM

## 2019-01-09 ENCOUNTER — Encounter: Payer: Self-pay | Admitting: *Deleted

## 2019-01-29 ENCOUNTER — Encounter: Payer: Medicare HMO | Admitting: Internal Medicine

## 2019-02-12 ENCOUNTER — Other Ambulatory Visit: Payer: Self-pay

## 2019-02-12 ENCOUNTER — Ambulatory Visit (INDEPENDENT_AMBULATORY_CARE_PROVIDER_SITE_OTHER): Payer: Medicare HMO | Admitting: Internal Medicine

## 2019-02-12 ENCOUNTER — Encounter: Payer: Self-pay | Admitting: Internal Medicine

## 2019-02-12 DIAGNOSIS — Z79899 Other long term (current) drug therapy: Secondary | ICD-10-CM | POA: Diagnosis not present

## 2019-02-12 DIAGNOSIS — I10 Essential (primary) hypertension: Secondary | ICD-10-CM | POA: Diagnosis not present

## 2019-02-12 MED ORDER — LISINOPRIL 40 MG PO TABS: 40.0000 mg | ORAL_TABLET | Freq: Every day | ORAL | 0 refills | Status: DC

## 2019-02-12 NOTE — Progress Notes (Signed)
Internal Medicine Clinic Attending  Case discussed with Dr. Masoudi  at the time of the visit.  We reviewed the resident's history and exam and pertinent patient test results.  I agree with the assessment, diagnosis, and plan of care documented in the resident's note.  

## 2019-02-12 NOTE — Progress Notes (Signed)
  [facility] Telephone Encounter Continuity Care Appointment  HPI:   This telephone encounter was created for [name] on 02/12/2019 for the following purpose/cc HTN follow up and medication refill.   Past Medical History:  Past Medical History:  Diagnosis Date  . Breast mass   . Cognitive impairment   . Coronary artery disease    s/p MI and CABG in 2014  . Dysmenorrhea   . Endometriosis   . Estrogen deficiency   . Hypertension   . Stroke [facility])    old left frontal infarct seen on CT scan 02/2017  . Tobacco abuse       ROS:  Review of Systems  Constitutional: Negative for fever.  Respiratory: Negative for shortness of breath.   Cardiovascular: Negative for chest pain and leg swelling.  Gastrointestinal: Negative for diarrhea.  Neurological: Negative for dizziness and headaches.     Assessment / Plan / Recommendations:   Please see A&P under problem oriented charting for assessment of the patient's acute and chronic medical conditions.   As always, pt is advised that if symptoms worsen or new symptoms arise, they should go to an urgent care facility or to to ER for further evaluation.   Consent and Medical Decision Making:   Patient discussed with Dr. Evette Doffing  This is a telephone encounter between [name] Todd and [facility] on 02/12/2019 for medication refill. The visit was conducted with the patient located at home and [facility]. The patient's identity was confirmed using their DOB and current address. The patient has consented to being evaluated through a telephone encounter and understands the associated risks (an examination cannot be done and the patient may need to come in for an appointment) / benefits (allows the patient to remain at home, decreasing exposure to coronavirus). I personally spent 12 minutes on medical discussion.

## 2019-02-12 NOTE — Assessment & Plan Note (Signed)
(  Televisit) I talked to [name] Todd on the phone. She denies any symptoms and no complaints and just asks for refill on Lisinopril. She denies any headache, dizziness.  She has not been in clinic for checking BMP or BP in past 10 months. I sent refill for Lisinopril but asked her to follow up with PCP soon.  -Sent refill for Lisinopril 40 mg every day -Continue Coreg 3.125 mg BID -In person visit at [facility] for BMP and BP check

## 2019-05-17 ENCOUNTER — Other Ambulatory Visit: Payer: Self-pay | Admitting: Internal Medicine

## 2019-05-17 DIAGNOSIS — I10 Essential (primary) hypertension: Secondary | ICD-10-CM

## 2019-07-01 ENCOUNTER — Other Ambulatory Visit: Payer: Self-pay | Admitting: Internal Medicine

## 2019-07-01 DIAGNOSIS — I10 Essential (primary) hypertension: Secondary | ICD-10-CM

## 2019-07-02 ENCOUNTER — Encounter: Payer: Self-pay | Admitting: Internal Medicine

## 2019-07-02 NOTE — Telephone Encounter (Signed)
Patient has not been seen in over 1 year. She needs to be seen asap, can place in acc for me to see

## 2019-07-04 NOTE — Telephone Encounter (Signed)
Per dr Delma Officer please set pt appt asap for refills

## 2019-07-05 NOTE — Telephone Encounter (Signed)
Called the patient and her mailbox is full will try again.  If no response will send a letter to the patient.

## 2019-07-08 ENCOUNTER — Encounter: Payer: Self-pay | Admitting: Internal Medicine

## 2019-08-02 ENCOUNTER — Encounter (HOSPITAL_COMMUNITY): Payer: Self-pay | Admitting: Emergency Medicine

## 2019-08-02 ENCOUNTER — Other Ambulatory Visit: Payer: Self-pay

## 2019-08-02 ENCOUNTER — Emergency Department (HOSPITAL_COMMUNITY)
Admission: EM | Admit: 2019-08-02 | Discharge: 2019-08-02 | Disposition: A | Discharge status: Home / Self Care | Payer: Medicare HMO | Attending: Emergency Medicine | Admitting: Emergency Medicine

## 2019-08-02 DIAGNOSIS — Z7982 Long term (current) use of aspirin: Secondary | ICD-10-CM | POA: Diagnosis not present

## 2019-08-02 DIAGNOSIS — F17210 Nicotine dependence, cigarettes, uncomplicated: Secondary | ICD-10-CM | POA: Diagnosis not present

## 2019-08-02 DIAGNOSIS — Z760 Encounter for issue of repeat prescription: Secondary | ICD-10-CM | POA: Diagnosis not present

## 2019-08-02 DIAGNOSIS — I10 Essential (primary) hypertension: Secondary | ICD-10-CM | POA: Insufficient documentation

## 2019-08-02 DIAGNOSIS — Z951 Presence of aortocoronary bypass graft: Secondary | ICD-10-CM | POA: Insufficient documentation

## 2019-08-02 DIAGNOSIS — I2510 Atherosclerotic heart disease of native coronary artery without angina pectoris: Secondary | ICD-10-CM | POA: Insufficient documentation

## 2019-08-02 DIAGNOSIS — Z79899 Other long term (current) drug therapy: Secondary | ICD-10-CM | POA: Diagnosis not present

## 2019-08-02 MED ORDER — LISINOPRIL 40 MG PO TABS: 40.0000 mg | ORAL_TABLET | Freq: Every day | ORAL | 0 refills | Status: DC

## 2019-08-02 NOTE — ED Provider Notes (Signed)
[facility] EMERGENCY DEPARTMENT Provider Note   CSN: [id number] Arrival date & time: 08/02/19  1051     History Chief Complaint  Patient presents with  . Medication Refill    [name] Todd is a 74 y.o. female.  HPI  HPI Comments: Barbaraann Avans is a 74 y.o. female who presents to the Emergency Department for medication refill.  Patient is prescribed 40 mg of lisinopril which she takes once daily for hypertension.  Her most recent prescription was on April 1 of this year.  Based on her records it appears that she has been out of this medication for 10 days.  Her current blood pressure is 186/115.  She denies any complaints at this time including chest pain, shortness of breath, dizziness, lightheadedness, headache, weakness, numbness.     Past Medical History:  Diagnosis Date  . Breast mass   . Cognitive impairment   . Coronary artery disease    s/p MI and CABG in 2014  . Dysmenorrhea   . Endometriosis   . Estrogen deficiency   . Hypertension   . Stroke [facility])    old left frontal infarct seen on CT scan 02/2017  . Tobacco abuse     Patient Active Problem List   Diagnosis Date Noted  . Thyroid nodule 04/25/2017  . History of CVA (cerebrovascular accident) 04/25/2017  . Cognitive impairment 04/25/2017  . Healthcare maintenance 04/25/2017  . Anxiety 04/25/2017  . CAD - RCA PCI followed by urgent CABG (Nl LVF) 09/26/2012  . S/P CABG x 4- urgent- 09/24/12 09/26/2012  . RBBB (present pre-op) 09/26/2012  . Hypertension 09/24/2012  . Tobacco abuse 09/24/2012  . Dysmenorrhea   . Breast mass   . Estrogen deficiency   . Endometriosis     Past Surgical History:  Procedure Laterality Date  . ABDOMINAL HYSTERECTOMY    . BACK SURGERY    . CORONARY ARTERY BYPASS GRAFT N/A 09/24/2012   Procedure: CORONARY ARTERY BYPASS GRAFTING (CABG);  Surgeon: Alleen Borne, MD;  Location: [facility] OR;  Service: Open Heart Surgery;  Laterality: N/A;  . ENDOVEIN HARVEST OF GREATER  SAPHENOUS VEIN Right 09/24/2012   Procedure: ENDOVEIN HARVEST OF GREATER SAPHENOUS VEIN;  Surgeon: Alleen Borne, MD;  Location: [facility] OR;  Service: Open Heart Surgery;  Laterality: Right;  . TEE WITHOUT CARDIOVERSION N/A 09/24/2012   Procedure: TRANSESOPHAGEAL ECHOCARDIOGRAM (TEE);  Surgeon: Alleen Borne, MD;  Location: [facility] OR;  Service: Open Heart Surgery;  Laterality: N/A;     OB History    Gravida  0   Para  0   Term  0   Preterm  0   AB  0   Living  0     SAB  0   TAB  0   Ectopic  0   Multiple  0   Live Births              Family History  Problem Relation Age of Onset  . Diabetes Maternal Grandmother   . Coronary artery disease Sister     Social History   Tobacco Use  . Smoking status: Current Every Day Smoker    Packs/day: 0.50    Types: Cigarettes  . Smokeless tobacco: Never Used  Substance Use Topics  . Alcohol use: No  . Drug use: No    Home Medications Prior to Admission medications   Medication Sig Start Date End Date Taking? Authorizing Provider  aspirin EC 81 MG tablet Take 1 tablet (  81 mg total) by mouth daily. 04/16/18   Santos-Sanchez, Merlene Morse, MD  atorvastatin (LIPITOR) 40 MG tablet TAKE 1 TABLET EVERY DAY  AFTER  SUPPER 05/02/18   Chundi, Verne Spurr, MD  carvedilol (COREG) 3.125 MG tablet TAKE 1 TABLET TWICE DAILY 04/30/18   Chundi, Vahini, MD  lisinopril (ZESTRIL) 40 MG tablet TAKE 1 TABLET EVERY DAY 05/17/19   Lars Mage, MD    Allergies    Patient has no known allergies.  Review of Systems   Review of Systems  Respiratory: Negative for shortness of breath and wheezing.   Cardiovascular: Negative for chest pain.  Gastrointestinal: Negative for abdominal pain, diarrhea, nausea and vomiting.  Neurological: Negative for dizziness, syncope, speech difficulty, weakness, light-headedness and headaches.  All other systems reviewed and are negative.  Physical Exam Updated Vital Signs BP (!) 186/115   Pulse 87   Temp 98.1 F (36.7 C)  (Oral)   Resp 18   SpO2 100%   Physical Exam Vitals and nursing note reviewed.  Constitutional:      General: She is not in acute distress.    Appearance: Normal appearance. She is not ill-appearing, toxic-appearing or diaphoretic.     Comments: Well-developed elderly female.  She is pleasant to converse with.  HENT:     Head: Normocephalic and atraumatic.     Right Ear: External ear normal.     Left Ear: External ear normal.     Nose: Nose normal.     Mouth/Throat:     Mouth: Mucous membranes are moist.     Pharynx: Oropharynx is clear. No oropharyngeal exudate or posterior oropharyngeal erythema.  Eyes:     Extraocular Movements: Extraocular movements intact.  Cardiovascular:     Rate and Rhythm: Normal rate and regular rhythm.     Pulses: Normal pulses.     Heart sounds: Normal heart sounds. No murmur heard.  No friction rub. No gallop.   Pulmonary:     Effort: Pulmonary effort is normal. No respiratory distress.     Breath sounds: Normal breath sounds. No stridor. No wheezing, rhonchi or rales.  Abdominal:     General: Abdomen is flat.     Tenderness: There is no abdominal tenderness.  Musculoskeletal:        General: Normal range of motion.     Cervical back: Normal range of motion and neck supple. No tenderness.  Skin:    General: Skin is warm and dry.  Neurological:     General: No focal deficit present.     Mental Status: She is alert.  Psychiatric:        Mood and Affect: Mood normal.        Behavior: Behavior normal.    ED Results / Procedures / Treatments   Labs (all labs ordered are listed, but only abnormal results are displayed) Labs Reviewed - No data to display  EKG None  Radiology No results found.  Procedures Procedures (including critical care time)  Medications Ordered in ED Medications - No data to display  ED Course  I have reviewed the triage vital signs and the nursing notes.  Pertinent labs & imaging results that were available  during my care of the patient were reviewed by me and considered in my medical decision making (see chart for details).    MDM Rules/Calculators/A&P                          Patient is a 74 year old  female with a history of hypertension that presents for medication refill.  She ran out of her 40 mg lisinopril about 10 days ago.  Her blood pressure is elevated today at 186/115.  She has no complaints at this time.  Physical exam is reassuring.  Lungs are clear to auscultation bilaterally.  Heart is regular rate and rhythm.  No tachycardia.  No murmurs, rubs, gallops.  We will refill the patient's medication.  I strongly recommended that she reach out to her primary care provider in the future for medication refills.  I also recommend that she reach out to her primary care provider to make sure she is receiving regular evaluations.  Her questions were answered and she was amicable to time of discharge.  Her vital signs are stable.  Patient discharged to home/self care.  Condition at discharge: Stable  Note: Portions of this report may have been transcribed using voice recognition software. Every effort was made to ensure accuracy; however, inadvertent computerized transcription errors may be present.    Final Clinical Impression(s) / ED Diagnoses Final diagnoses:  Medication refill    Rx / DC Orders ED Discharge Orders         Ordered    lisinopril (ZESTRIL) 40 MG tablet  Daily     Discontinue  Reprint     08/02/19 1152           Placido Sou, PA-C 08/02/19 1154    Raeford Razor, MD 08/03/19 1305

## 2019-08-02 NOTE — ED Notes (Signed)
Pt not in room when this RN entered to get discharge vitals and give patient discharge paperwork.

## 2019-08-02 NOTE — Discharge Instructions (Addendum)
Please follow-up with your primary care doctor as soon as possible.  Please take your lisinopril once daily as prescribed.  Please return to the emergency department if your symptoms worsen.  It was a pleasure to meet you.

## 2019-08-02 NOTE — ED Triage Notes (Signed)
Patient arrives to ED with complaints of needing her lisinopril refilled.

## 2019-08-05 ENCOUNTER — Ambulatory Visit: Payer: Medicare HMO

## 2019-08-09 ENCOUNTER — Encounter: Payer: Self-pay | Admitting: *Deleted

## 2019-09-05 DIAGNOSIS — F17290 Nicotine dependence, other tobacco product, uncomplicated: Secondary | ICD-10-CM | POA: Diagnosis not present

## 2019-09-05 DIAGNOSIS — E785 Hyperlipidemia, unspecified: Secondary | ICD-10-CM | POA: Diagnosis not present

## 2019-09-05 DIAGNOSIS — I10 Essential (primary) hypertension: Secondary | ICD-10-CM | POA: Diagnosis not present

## 2019-09-05 DIAGNOSIS — I2510 Atherosclerotic heart disease of native coronary artery without angina pectoris: Secondary | ICD-10-CM | POA: Diagnosis not present

## 2019-09-27 ENCOUNTER — Ambulatory Visit (INDEPENDENT_AMBULATORY_CARE_PROVIDER_SITE_OTHER): Payer: Medicare HMO | Admitting: Family

## 2019-09-27 ENCOUNTER — Encounter: Payer: Self-pay | Admitting: Family

## 2019-09-27 ENCOUNTER — Other Ambulatory Visit: Payer: Self-pay

## 2019-09-27 VITALS — BP 140/110 | HR 78 | Temp 97.7°F | Resp 16 | Ht 64.0 in | Wt 102.6 lb

## 2019-09-27 DIAGNOSIS — Z681 Body mass index (BMI) 19 or less, adult: Secondary | ICD-10-CM

## 2019-09-27 DIAGNOSIS — F0280 Dementia in other diseases classified elsewhere without behavioral disturbance: Secondary | ICD-10-CM

## 2019-09-27 DIAGNOSIS — Z8673 Personal history of transient ischemic attack (TIA), and cerebral infarction without residual deficits: Secondary | ICD-10-CM | POA: Diagnosis not present

## 2019-09-27 DIAGNOSIS — G300 Alzheimer's disease with early onset: Secondary | ICD-10-CM

## 2019-09-27 DIAGNOSIS — I10 Essential (primary) hypertension: Secondary | ICD-10-CM | POA: Diagnosis not present

## 2019-09-27 DIAGNOSIS — I25810 Atherosclerosis of coronary artery bypass graft(s) without angina pectoris: Secondary | ICD-10-CM | POA: Diagnosis not present

## 2019-09-27 DIAGNOSIS — Z720 Tobacco use: Secondary | ICD-10-CM

## 2019-09-27 DIAGNOSIS — E785 Hyperlipidemia, unspecified: Secondary | ICD-10-CM

## 2019-09-27 DIAGNOSIS — R636 Underweight: Secondary | ICD-10-CM

## 2019-09-27 DIAGNOSIS — Z23 Encounter for immunization: Secondary | ICD-10-CM

## 2019-09-27 MED ORDER — TETANUS-DIPHTH-ACELL PERTUSSIS 5-2.5-18.5 LF-MCG/0.5 IM SUSP: 0.5000 mL | Freq: Once | INTRAMUSCULAR | 0 refills | Status: AC

## 2019-09-27 MED ORDER — ATORVASTATIN CALCIUM 20 MG PO TABS: 20.0000 mg | ORAL_TABLET | Freq: Every day | ORAL | 3 refills | Status: DC

## 2019-09-27 MED ORDER — LISINOPRIL 20 MG PO TABS: 20.0000 mg | ORAL_TABLET | Freq: Every day | ORAL | 3 refills | Status: DC

## 2019-09-27 MED ORDER — DONEPEZIL HCL 5 MG PO TABS: 5.0000 mg | ORAL_TABLET | Freq: Every day | ORAL | 3 refills | Status: DC

## 2019-09-27 MED ORDER — METOPROLOL SUCCINATE ER 25 MG PO TB24: 25.0000 mg | ORAL_TABLET | Freq: Every day | ORAL | 0 refills | Status: DC

## 2019-09-27 MED ORDER — CLONIDINE HCL 0.1 MG PO TABS
0.1000 mg | ORAL_TABLET | Freq: Once | ORAL | Status: AC
  Administered 2019-09-27: 0.1 mg via ORAL

## 2019-09-27 MED ORDER — NITROGLYCERIN 0.4 MG SL SUBL: 0.4000 mg | SUBLINGUAL_TABLET | SUBLINGUAL | 1 refills | Status: DC | PRN

## 2019-09-27 NOTE — Progress Notes (Signed)
Provider: Marlowe Sax FNP-C   Delshawn Stech, Nelda Bucks, NP  Patient Care Team: Tyrone Balash, Nelda Bucks, NP as PCP - General (Family Medicine)  Extended Emergency Contact Information Primary Emergency Contact: Archie Patten Mobile Phone: [phone] Relation: Niece Secondary Emergency Contact: Hines,Pamela Address: [place], [place] Phone: [phone] Relation: Sister  Code Status: Full Code  Goals of care: Advanced Directive information Advanced Directives 09/27/2019  Does Patient Have a Medical Advance Directive? No  Would patient like information on creating a medical advance directive? No - Patient declined     Chief Complaint  Patient presents with  . Establish Care    New Patient .    HPI:  Pt is a 74 y.o. female seen today to establish care for medical management of chronic diseases.she is here with her daughter and son who provides additional HPI information.she has medical histroy of Hypertension,CAD S/p MI and CABG in 2014,Stroke,Dementia,Breast mass and Tobacco disorder.she smokes 1/2 pack per day for the past 40 years.she follows up with Cardiologist Dr.Harwani   Daughter states eats sometimes but skips meals.she has had weight loss.Has had difficulties preparing food,managing her medication and her finances due to confusion and being forgetful.she is able to shower and dress self but requires reminding at times.states walks daily.    Past Medical History:  Diagnosis Date  . Breast mass   . Cognitive impairment   . Coronary artery disease    s/p MI and CABG in 2014  . Dysmenorrhea   . Endometriosis   . Estrogen deficiency   . Heart attack ([place])   . Hypertension   . Stroke [facility])    old left frontal infarct seen on CT scan 02/2017  . Tobacco abuse    Past Surgical History:  Procedure Laterality Date  . ABDOMINAL HYSTERECTOMY    . BACK SURGERY    . CORONARY ARTERY BYPASS GRAFT N/A 09/24/2012   Procedure:  CORONARY ARTERY BYPASS GRAFTING (CABG);  Surgeon: Gaye Pollack, MD;  Location: [place];  Service: Open Heart Surgery;  Laterality: N/A;  . ENDOVEIN HARVEST OF GREATER SAPHENOUS VEIN Right 09/24/2012   Procedure: ENDOVEIN HARVEST OF GREATER SAPHENOUS VEIN;  Surgeon: Gaye Pollack, MD;  Location: [place];  Service: Open Heart Surgery;  Laterality: Right;  . TEE WITHOUT CARDIOVERSION N/A 09/24/2012   Procedure: TRANSESOPHAGEAL ECHOCARDIOGRAM (TEE);  Surgeon: Gaye Pollack, MD;  Location: [place];  Service: Open Heart Surgery;  Laterality: N/A;    No Known Allergies  Allergies as of 09/27/2019   No Known Allergies     Medication List       Accurate as of September 27, 2019  4:12 PM. If you have any questions, ask your nurse or doctor.        STOP taking these medications   aspirin EC 81 MG tablet Stopped by: Sandrea Hughs, NP     TAKE these medications   atorvastatin 20 MG tablet Commonly known as: LIPITOR Take 1 tablet (20 mg total) by mouth daily.   donepezil 5 MG tablet Commonly known as: ARICEPT Take 1 tablet (5 mg total) by mouth daily.   lisinopril 20 MG tablet Commonly known as: ZESTRIL Take 1 tablet (20 mg total) by mouth daily.   metoprolol succinate 25 MG 24 hr tablet Commonly known as: TOPROL-XL Take 1 tablet (25 mg total) by mouth daily.   nitroGLYCERIN 0.4 MG SL tablet Commonly known  as: NITROSTAT Place 1 tablet (0.4 mg total) under the tongue every 5 (five) minutes as needed for chest pain.   Tdap 5-2.5-18.5 LF-MCG/0.5 injection Commonly known as: BOOSTRIX Inject 0.5 mLs into the muscle once for 1 dose.       Review of Systems  Constitutional: Negative for appetite change, chills, fatigue and fever.  HENT: Negative for congestion, hearing loss, rhinorrhea, sinus pressure, sinus pain, sneezing, sore throat and trouble swallowing.   Eyes: Positive for visual disturbance. Negative for discharge, redness and itching.       Wears eye glasses   Respiratory:  Negative for cough, chest tightness, shortness of breath and wheezing.   Cardiovascular: Negative for chest pain, palpitations and leg swelling.  Gastrointestinal: Negative for abdominal distention, abdominal pain, constipation, diarrhea, nausea and vomiting.  Endocrine: Negative for cold intolerance, heat intolerance, polydipsia, polyphagia and polyuria.  Genitourinary: Negative for difficulty urinating, dysuria, flank pain, frequency, urgency and vaginal discharge.  Musculoskeletal: Negative for arthralgias, gait problem, neck pain and neck stiffness.       No fall episode   Skin: Negative for color change, pallor and rash.  Neurological: Negative for dizziness, speech difficulty, weakness, light-headedness, numbness and headaches.  Hematological: Does not bruise/bleed easily.  Psychiatric/Behavioral: Negative for agitation, behavioral problems, confusion, hallucinations and sleep disturbance. The patient is not nervous/anxious.        Forgetful      There is no immunization history on file for this patient. Pertinent  Health Maintenance Due  Topic Date Due  . INFLUENZA VACCINE  09/22/2019  . MAMMOGRAM  09/26/2020 (Originally 11/17/1996)  . DEXA SCAN  09/26/2020 (Originally 11/18/2011)  . COLONOSCOPY  09/26/2020 (Originally 11/17/1996)  . PNA vac Low Risk Adult (1 of 2 - PCV13) 09/26/2020 (Originally 11/18/2011)   Fall Risk  09/27/2019 04/16/2018 04/02/2018  Falls in the past year? 0 0 0  Number falls in past yr: 0 - -  Injury with Fall? 0 - -    Vitals:   09/27/19 1331 09/27/19 1510  BP: (!) 180/120 (!) 140/110  Pulse: 78   Resp: 16   Temp: 97.7 F (36.5 C)   SpO2: 99%   Weight: 102 lb 9.6 oz (46.5 kg)   Height: '5\' 4"'  (1.626 m)    Body mass index is 17.61 kg/m. Physical Exam Vitals reviewed.  Constitutional:      General: She is not in acute distress.    Appearance: She is not ill-appearing.  HENT:     Head: Normocephalic.     Right Ear: Tympanic membrane, ear canal and  external ear normal. There is no impacted cerumen.     Left Ear: Tympanic membrane, ear canal and external ear normal. There is no impacted cerumen.     Nose: Nose normal. No congestion or rhinorrhea.     Mouth/Throat:     Mouth: Mucous membranes are moist.     Pharynx: No oropharyngeal exudate or posterior oropharyngeal erythema.  Eyes:     General: No scleral icterus.       Right eye: No discharge.        Left eye: No discharge.     Extraocular Movements: Extraocular movements intact.     Conjunctiva/sclera: Conjunctivae normal.     Pupils: Pupils are equal, round, and reactive to light.  Neck:     Vascular: No carotid bruit.  Cardiovascular:     Rate and Rhythm: Normal rate and regular rhythm.     Pulses: Normal pulses.  Heart sounds: Normal heart sounds. No murmur heard.  No friction rub. No gallop.   Pulmonary:     Effort: Pulmonary effort is normal. No respiratory distress.     Breath sounds: Normal breath sounds. No wheezing, rhonchi or rales.  Chest:     Chest wall: No tenderness.  Abdominal:     General: Bowel sounds are normal. There is no distension.     Palpations: Abdomen is soft. There is no mass.     Tenderness: There is no abdominal tenderness. There is no right CVA tenderness, left CVA tenderness, guarding or rebound.  Musculoskeletal:        General: No swelling or tenderness. Normal range of motion.     Cervical back: Normal range of motion. No rigidity or tenderness.     Right lower leg: No edema.     Left lower leg: No edema.  Lymphadenopathy:     Cervical: No cervical adenopathy.  Skin:    General: Skin is warm and dry.     Coloration: Skin is not pale.     Findings: No bruising, erythema or rash.  Neurological:     Mental Status: She is alert.     Cranial Nerves: No cranial nerve deficit.     Sensory: No sensory deficit.     Motor: No weakness.     Coordination: Coordination normal.     Gait: Gait normal.     Comments: Alert and oriented to  person and place  Psychiatric:        Mood and Affect: Mood is not anxious, depressed or elated. Affect is angry. Affect is not tearful.        Speech: Speech normal.        Behavior: Behavior normal.        Thought Content: Thought content normal.        Cognition and Memory: Memory is impaired.     Comments: Scored 16/30 on MMSE     Labs reviewed:  Lab Results  Component Value Date   TSH 1.320 04/25/2017   Lab Results  Component Value Date   HGBA1C 5.7 (H) 09/24/2012   Lab Results  Component Value Date   CHOL 105 09/24/2012   HDL 37 (L) 09/24/2012   LDLCALC 58 09/24/2012   TRIG 50 09/24/2012   CHOLHDL 2.8 09/24/2012    Significant Diagnostic Results in last 30 days:  No results found.  Assessment/Plan  1. Essential hypertension B/p elevated due to not taking medication.clonidine 0.1 mg tablet given during visit with much improvement. - cloNIDine (CATAPRES) tablet 0.1 mg - CBC with Differential/Platelet - CMP with eGFR([facility]) - TSH - lisinopril (ZESTRIL) 20 MG tablet; Take 1 tablet (20 mg total) by mouth daily.  Dispense: 30 tablet; Refill: 3 - metoprolol succinate (TOPROL-XL) 25 MG 24 hr tablet; Take 1 tablet (25 mg total) by mouth daily.  Dispense: 30 tablet; Refill: 0 - Ambulatory referral to [place] Nurse to assist with medication management and blood pressure check.  2. Hyperlipidemia LDL goal <100 No latest LDL for review. - continue on Atorvastatin 20 mg tablet  - Lipid Panel - atorvastatin (LIPITOR) 20 MG tablet; Take 1 tablet (20 mg total) by mouth daily.  Dispense: 30 tablet; Refill: 3 - Ambulatory referral to Home Health as above   3. History of CVA (cerebrovascular accident) Continue to control high risk factors. Continue on lisinopril,metoprolol and atorvastatin   4. Need for Tdap vaccination Advised to get Tdap vaccine at her pharmacy -  Tdap (BOOSTRIX) 5-2.5-18.5 LF-MCG/0.5 injection; Inject 0.5 mLs into the muscle once for 1 dose.   Dispense: 0.5 mL; Refill: 0  5. Tobacco abuse Smoking cessation advised.  6. Coronary artery disease involving coronary bypass graft of native heart without angina pectoris Chest pain free.continue on Nitro as needed. - nitroGLYCERIN (NITROSTAT) 0.4 MG SL tablet; Place 1 tablet (0.4 mg total) under the tongue every 5 (five) minutes as needed for chest pain.  Dispense: 5 tablet; Refill: 1  7. Underweight Not eating meals not cooking due to her memory issues.will require Nurse assistant to assist with ADL's and social worker to arrange for further in home service or need for placement.  Nurse Assistant for assist with activity of daily Living,Social worker to assess social needs.   8. Body mass index (BMI) of 19 or less in adult BMI 17.61 Not eating meals or cooking as above. - Chief Executive Officer and Education officer, museum as above.   9. Early onset Alzheimer's dementia without behavioral disturbance ([place]) Will discontinue Aricept if still loosing weight.Home health Nurse assistant as above. - continue to with supportive care. - donepezil (ARICEPT) 5 MG tablet; Take 1 tablet (5 mg total) by mouth daily.  Dispense: 30 tablet; Refill: 3 - Ambulatory referral to [facility] for medication management,Nurse Assistant for assist with activity of daily Living,Social worker to assess social needs.  Family/ staff Communication: Reviewed plan of care with patient and POA verbalized understanding.   Labs/tests ordered:   - CBC with Differential/Platelet - CMP with eGFR([facility]) - TSH - Lipid Panel  Next Appointment : 2 weeks for blood pressure follow up   Sandrea Hughs, NP

## 2019-09-27 NOTE — Patient Instructions (Signed)

## 2019-10-04 DIAGNOSIS — I2510 Atherosclerotic heart disease of native coronary artery without angina pectoris: Secondary | ICD-10-CM | POA: Diagnosis not present

## 2019-10-04 DIAGNOSIS — I10 Essential (primary) hypertension: Secondary | ICD-10-CM | POA: Diagnosis not present

## 2019-10-04 DIAGNOSIS — E785 Hyperlipidemia, unspecified: Secondary | ICD-10-CM | POA: Diagnosis not present

## 2019-10-04 DIAGNOSIS — F17290 Nicotine dependence, other tobacco product, uncomplicated: Secondary | ICD-10-CM | POA: Diagnosis not present

## 2019-10-08 ENCOUNTER — Telehealth: Payer: Self-pay | Admitting: *Deleted

## 2019-10-08 NOTE — Telephone Encounter (Signed)
Received fax from [facility] regarding filling out paperwork for patient for [facility]) form.   Filled out and placed in Dinah's folder to review, fill out and sign.

## 2019-10-15 ENCOUNTER — Ambulatory Visit: Payer: Medicare HMO | Admitting: Family

## 2019-10-30 ENCOUNTER — Telehealth: Payer: Self-pay | Admitting: Family

## 2019-10-30 NOTE — Telephone Encounter (Signed)
[name] Todd,try calling patient and Niece [name] Todd again for Home health referral if does not pick after three phone calls will need to send her a letter notifying to call [facility] office and make her aware that we've tried to contact her.

## 2019-12-17 ENCOUNTER — Other Ambulatory Visit: Payer: Self-pay

## 2019-12-17 ENCOUNTER — Telehealth: Payer: Self-pay

## 2019-12-17 ENCOUNTER — Encounter: Payer: Self-pay | Admitting: Family

## 2019-12-17 ENCOUNTER — Ambulatory Visit (INDEPENDENT_AMBULATORY_CARE_PROVIDER_SITE_OTHER): Payer: Medicare HMO | Admitting: Family

## 2019-12-17 VITALS — BP 140/90 | HR 69 | Temp 97.7°F | Resp 16 | Ht 64.0 in | Wt 99.4 lb

## 2019-12-17 DIAGNOSIS — I10 Essential (primary) hypertension: Secondary | ICD-10-CM

## 2019-12-17 DIAGNOSIS — F0280 Dementia in other diseases classified elsewhere without behavioral disturbance: Secondary | ICD-10-CM

## 2019-12-17 DIAGNOSIS — E785 Hyperlipidemia, unspecified: Secondary | ICD-10-CM | POA: Diagnosis not present

## 2019-12-17 DIAGNOSIS — R634 Abnormal weight loss: Secondary | ICD-10-CM | POA: Diagnosis not present

## 2019-12-17 DIAGNOSIS — Z23 Encounter for immunization: Secondary | ICD-10-CM

## 2019-12-17 DIAGNOSIS — G300 Alzheimer's disease with early onset: Secondary | ICD-10-CM | POA: Diagnosis not present

## 2019-12-17 DIAGNOSIS — Z720 Tobacco use: Secondary | ICD-10-CM | POA: Diagnosis not present

## 2019-12-17 MED ORDER — BOOSTRIX 5-2.5-18.5 LF-MCG/0.5 IM SUSP: 0.5000 mL | Freq: Once | INTRAMUSCULAR | 0 refills | Status: AC

## 2019-12-17 MED ORDER — LISINOPRIL 20 MG PO TABS: 20.0000 mg | ORAL_TABLET | Freq: Every day | ORAL | 3 refills | Status: DC

## 2019-12-17 MED ORDER — CLONIDINE HCL 0.1 MG PO TABS
0.1000 mg | ORAL_TABLET | Freq: Once | ORAL | Status: AC
  Administered 2019-12-17: 0.1 mg via ORAL

## 2019-12-17 MED ORDER — ATORVASTATIN CALCIUM 20 MG PO TABS: 20.0000 mg | ORAL_TABLET | Freq: Every day | ORAL | 3 refills | Status: DC

## 2019-12-17 MED ORDER — METOPROLOL SUCCINATE ER 25 MG PO TB24: 25.0000 mg | ORAL_TABLET | Freq: Every day | ORAL | 3 refills | Status: DC

## 2019-12-17 NOTE — Telephone Encounter (Signed)
Tried calling patient to schedule AWV got vm, but it was full. Patient needs to schedule AWV

## 2019-12-17 NOTE — Progress Notes (Signed)
Provider: Richarda Blade FNP-C  Mando Blatz, Donalee Citrin, NP  Patient Care Team: Tynetta Bachmann, Donalee Citrin, NP as PCP - General (Family Medicine)  Extended Emergency Contact Information Primary Emergency Contact: Hervey Ard Mobile Phone: [phone] Relation: Niece Secondary Emergency Contact: Hines,Pamela Address: [address]          [place], [place] Home Phone: [phone] Relation: Sister  Code Status: Full Code  Goals of care: Advanced Directive information Advanced Directives 12/17/2019  Does Patient Have a Medical Advance Directive? No  Would patient like information on creating a medical advance directive? No - Patient declined     Chief Complaint  Patient presents with  . Medical Management of Chronic Issues    Follow Up Visit  . Immunizations    Discuss the need for Tetanus Vaccine, Covid Vaccine, and Influenza Vaccine.   . Labs    Fasting Labs    HPI:  Pt is a 74 y.o. female seen today for an acute visit for fololw up blood pressure.She is here with her niece Andrey Campanile and also spoke with daughter April on sandy's cell phone during visit  she was here in august ,2021.Her blood pressure was high was supposed to follow up in two weeks but did not.Today her B/p 170/120 States usually takes her lisinopril but not aware of any other medication.She is supposed to take metoprolol succinate 25 mg tablet daily.also not taking her atorvastatin and Aricept.States does not know where they are.   She lives alone in her apartment.Tells me has good neighbors.States does not like to cook so she skips meals.might eat piece of chicken but not a whole lot.she drinks coffee in the morning.Niece states sees her once in a while whenever she calls to come and take her shopping or somewhere. She denies any fall episodes or acute illness. She continues to smoke about a pack per day and sometimes not even a pack." I don't smoke a whole lot like other people". " Also states  smoking makes me feel good"  She is due for her Influenza vaccine.Tdap vaccine previous send to pharmacy but looks like did not go to pharmacy to get vaccine.Neice reminded a gain to take to pharmacy to get Tdap vaccine.  COVID-19 vaccine recommended by states does not really get sick. Due for fasting lab worker ordered on previous visit.  April daughter request paperwork filled for [employer] to get assistance for Aide to come and help with her ADL's.she will contact company to fax paperwork.   Past Medical History:  Diagnosis Date  . Breast mass   . Cognitive impairment   . Coronary artery disease    s/p MI and CABG in 2014  . Dysmenorrhea   . Endometriosis   . Estrogen deficiency   . Heart attack (HCC)   . Hypertension   . Stroke [facility])    old left frontal infarct seen on CT scan 02/2017  . Tobacco abuse    Past Surgical History:  Procedure Laterality Date  . ABDOMINAL HYSTERECTOMY    . BACK SURGERY    . CORONARY ARTERY BYPASS GRAFT N/A 09/24/2012   Procedure: CORONARY ARTERY BYPASS GRAFTING (CABG);  Surgeon: Alleen Borne, MD;  Location: [facility] OR;  Service: Open Heart Surgery;  Laterality: N/A;  . ENDOVEIN HARVEST OF GREATER SAPHENOUS VEIN Right 09/24/2012   Procedure: ENDOVEIN HARVEST OF GREATER SAPHENOUS VEIN;  Surgeon: Alleen Borne, MD;  Location: [facility] OR;  Service: Open Heart Surgery;  Laterality: Right;  .  TEE WITHOUT CARDIOVERSION N/A 09/24/2012   Procedure: TRANSESOPHAGEAL ECHOCARDIOGRAM (TEE);  Surgeon: Alleen Borne, MD;  Location[facility] OR;  Service: Open Heart Surgery;  Laterality: N/A;    No Known Allergies  Outpatient Encounter Medications as of 12/17/2019  Medication Sig  . lisinopril (ZESTRIL) 20 MG tablet Take 1 tablet (20 mg total) by mouth daily.  . [DISCONTINUED] atorvastatin (LIPITOR) 20 MG tablet Take 1 tablet (20 mg total) by mouth daily.  . [DISCONTINUED] donepezil (ARICEPT) 5 MG tablet Take 1 tablet (5 mg total) by mouth daily.  . [DISCONTINUED]  metoprolol succinate (TOPROL-XL) 25 MG 24 hr tablet Take 1 tablet (25 mg total) by mouth daily.  . [DISCONTINUED] nitroGLYCERIN (NITROSTAT) 0.4 MG SL tablet Place 1 tablet (0.4 mg total) under the tongue every 5 (five) minutes as needed for chest pain.   No facility-administered encounter medications on file as of 12/17/2019.    Review of Systems  Constitutional: Negative for appetite change, chills, fatigue and fever.       Skips meals in the morning and lunch.sometimes eats chicken.doesn't eat a lot.  HENT: Negative for congestion, rhinorrhea, sinus pressure, sinus pain, sneezing, sore throat and trouble swallowing.   Eyes: Positive for visual disturbance. Negative for discharge, redness and itching.       Wears eye glasses   Respiratory: Negative for cough, chest tightness, shortness of breath and wheezing.   Cardiovascular: Negative for chest pain, palpitations and leg swelling.  Gastrointestinal: Negative for abdominal distention, abdominal pain, constipation, diarrhea, nausea and vomiting.  Endocrine: Negative for cold intolerance, heat intolerance, polydipsia, polyphagia and polyuria.  Genitourinary: Negative for difficulty urinating, dysuria, flank pain, frequency and urgency.  Musculoskeletal: Negative for arthralgias, gait problem, joint swelling and myalgias.  Skin: Negative for color change, pallor and rash.  Neurological: Negative for dizziness, seizures, speech difficulty, weakness, light-headedness and headaches.  Hematological: Does not bruise/bleed easily.  Psychiatric/Behavioral: Negative for agitation, behavioral problems, confusion, decreased concentration, hallucinations and sleep disturbance. The patient is not nervous/anxious.        Forgetful      There is no immunization history on file for this patient. Pertinent  Health Maintenance Due  Topic Date Due  . INFLUENZA VACCINE  Never done  . MAMMOGRAM  09/26/2020 (Originally 11/17/1996)  . DEXA SCAN  09/26/2020  (Originally 11/18/2011)  . COLONOSCOPY  09/26/2020 (Originally 11/17/1996)  . PNA vac Low Risk Adult (1 of 2 - PCV13) 09/26/2020 (Originally 11/18/2011)   Fall Risk  12/17/2019 09/27/2019 04/16/2018 04/02/2018  Falls in the past year? 0 0 0 0  Number falls in past yr: 0 0 - -  Injury with Fall? 0 0 - -   Functional Status Survey:    Vitals:   12/17/19 0823  BP: (!) 170/120  Pulse: 69  Resp: 16  Temp: 97.7 F (36.5 C)  SpO2: 95%  Weight: 99 lb 6.4 oz (45.1 kg)  Height: 5\' 4"  (1.626 m)   Body mass index is 17.06 kg/m. Physical Exam Vitals reviewed.  Constitutional:      General: She is not in acute distress.    Appearance: She is underweight. She is not ill-appearing.  HENT:     Head: Normocephalic.     Right Ear: Tympanic membrane, ear canal and external ear normal. There is no impacted cerumen.     Left Ear: Tympanic membrane, ear canal and external ear normal. There is no impacted cerumen.     Nose: Nose normal. No rhinorrhea.  Mouth/Throat:     Mouth: Mucous membranes are moist.     Pharynx: Oropharynx is clear. No oropharyngeal exudate or posterior oropharyngeal erythema.  Eyes:     General: No scleral icterus.       Right eye: No discharge.        Left eye: No discharge.     Extraocular Movements: Extraocular movements intact.     Conjunctiva/sclera: Conjunctivae normal.     Pupils: Pupils are equal, round, and reactive to light.  Neck:     Vascular: No carotid bruit.  Cardiovascular:     Rate and Rhythm: Normal rate and regular rhythm.     Pulses: Normal pulses.     Heart sounds: Normal heart sounds. No murmur heard.  No friction rub. No gallop.   Pulmonary:     Effort: Pulmonary effort is normal. No respiratory distress.     Breath sounds: Normal breath sounds. No wheezing, rhonchi or rales.  Chest:     Chest wall: No tenderness.  Abdominal:     General: Bowel sounds are normal. There is no distension.     Palpations: Abdomen is soft. There is no mass.      Tenderness: There is no abdominal tenderness. There is no right CVA tenderness, left CVA tenderness, guarding or rebound.  Musculoskeletal:        General: No swelling or tenderness.     Cervical back: Normal range of motion. No rigidity or tenderness.     Right lower leg: No edema.     Left lower leg: No edema.     Comments: Unsteady gait verse to the right slightly.   Lymphadenopathy:     Cervical: No cervical adenopathy.  Skin:    General: Skin is warm and dry.     Coloration: Skin is not pale.     Findings: No bruising, erythema or rash.  Neurological:     Mental Status: She is alert.     Cranial Nerves: No cranial nerve deficit.     Sensory: No sensory deficit.     Motor: No weakness.     Coordination: Coordination normal.     Comments: Alert and oriented to person and place. Forgetful   Psychiatric:        Mood and Affect: Mood normal.        Speech: Speech normal.        Behavior: Behavior normal.        Thought Content: Thought content normal.        Cognition and Memory: Cognition is impaired. Memory is impaired.     Labs reviewed:  Lab Results  Component Value Date   TSH 1.320 04/25/2017   Lab Results  Component Value Date   HGBA1C 5.7 (H) 09/24/2012   Lab Results  Component Value Date   CHOL 105 09/24/2012   HDL 37 (L) 09/24/2012   LDLCALC 58 09/24/2012   TRIG 50 09/24/2012   CHOLHDL 2.8 09/24/2012    Significant Diagnostic Results in last 30 days:  No results found.  Assessment/Plan 1. Uncontrolled hypertension B/p 170/120 today clonidine 0.1 mg tablet given with much improvement.Advised to take medication as directed. - check blood pressure once daily and record then bring log to visit to evaluate in 2 weeks.  - Advised to take metoprolol succinate and lisinopril - cloNIDine (CATAPRES) tablet 0.1 mg - lisinopril (ZESTRIL) 20 MG tablet; Take 1 tablet (20 mg total) by mouth daily.  Dispense: 30 tablet; Refill: 3 - metoprolol succinate (TOPROL-XL)  25 MG 24 hr tablet; Take 1 tablet (25 mg total) by mouth daily.  Dispense: 30 tablet; Refill: 3 - [facility] for B/p medication management.   2. Need for Tdap vaccination Script send to pharmacy.Andrey CampanileSandy advised to take to get vaccine in 1-2 weeks at her pharmacy.  - Tdap (BOOSTRIX) 5-2.5-18.5 LF-MCG/0.5 injection; Inject 0.5 mLs into the muscle once for 1 dose.  Dispense: 0.5 mL; Refill: 0  3. Hyperlipidemia LDL goal <100 Has been off atorvastatin.Advised to restart Atorvastatin as below.  - atorvastatin (LIPITOR) 20 MG tablet; Take 1 tablet (20 mg total) by mouth daily.  Dispense: 30 tablet; Refill: 3  4. Early onset Alzheimer's dementia without behavioral disturbance (HCC) Forgetful.Not cooking and skipping her meals.has had weight loss. Lives alone in an apartment.  - [facility] for medication management and assist in community service for meals.   5. Weight loss Skips meals suspect due to her cognitive impairment. - [facility] as above   6. Tobacco abuse Smoking cessation advised but not ready to quit " smoking makes me feel good". Will continue to follow up   7. Need for influenza vaccination Afebrile.No signs of URI's.Flu vaccine administered by CMA Jasmine. No reaction reported.  - Flu Vaccine QUAD High Dose(Fluad)   Family/ staff Communication: Reviewed plan of care with patient,Niece Andrey CampanileSandy and daughter April on the cell phone.   Labs/tests ordered: Has fasting lab orders from previous visit.  Next Appointment: 2 weeks for blood pressure evaluation.   Caesar Bookmaninah C Oliviarose Punch, NP

## 2019-12-17 NOTE — Patient Instructions (Addendum)
- please check blood pressure once daily and record.bring log to visit in two weeks.  Tobacco Use Disorder Tobacco use disorder (TUD) occurs when a person craves, seeks, and uses tobacco, regardless of the consequences. This disorder can cause problems with mental and physical health. It can affect your ability to have healthy relationships, and it can keep you from meeting your responsibilities at work, home, or school. Tobacco may be:  Smoked as a cigarette or cigar.  Inhaled using e-cigarettes.  Smoked in a pipe or hookah.  Chewed as smokeless tobacco.  Inhaled into the nostrils as snuff. Tobacco products contain a dangerous chemical called nicotine, which is very addictive. Nicotine triggers hormones that make the body feel stimulated and works on areas of the brain that make you feel good. These effects can make it hard for people to quit nicotine. Tobacco contains many other unsafe chemicals that can damage almost every organ in the body. Smoking tobacco also puts others in danger due to fire risk and possible health problems caused by breathing in secondhand smoke. What are the signs or symptoms? Symptoms of TUD may include:  Being unable to slow down or stop your tobacco use.  Spending an abnormal amount of time getting or using tobacco.  Craving tobacco. Cravings may last for up to 6 months after quitting.  Tobacco use that: ? Interferes with your work, school, or home life. ? Interferes with your personal and social relationships. ? Makes you give up activities that you once enjoyed or found important.  Using tobacco even though you know that it is: ? Dangerous or bad for your health or someone else's health. ? Causing problems in your life.  Needing more and more of the substance to get the same effect (developing tolerance).  Experiencing unpleasant symptoms if you do not use the substance (withdrawal). Withdrawal symptoms may include: ? Depressed, anxious, or  irritable mood. ? Difficulty concentrating. ? Increased appetite. ? Restlessness or trouble sleeping.  Using the substance to avoid withdrawal. How is this diagnosed? This condition may be diagnosed based on:  Your current and past tobacco use. Your health care provider may ask questions about how your tobacco use affects your life.  A physical exam. You may be diagnosed with TUD if you have at least two symptoms within a 75-month period. How is this treated? This condition is treated by stopping tobacco use. Many people are unable to quit on their own and need help. Treatment may include:  Nicotine replacement therapy (NRT). NRT provides nicotine without the other harmful chemicals in tobacco. NRT gradually lowers the dosage of nicotine in the body and reduces withdrawal symptoms. NRT is available as: ? Over-the-counter gums, lozenges, and skin patches. ? Prescription mouth inhalers and nasal sprays.  Medicine that acts on the brain to reduce cravings and withdrawal symptoms.  A type of talk therapy that examines your triggers for tobacco use, how to avoid them, and how to cope with cravings (behavioral therapy).  Hypnosis. This may help with withdrawal symptoms.  Joining a support group for others coping with TUD. The best treatment for TUD is usually a combination of medicine, talk therapy, and support groups. Recovery can be a long process. Many people start using tobacco again after stopping (relapse). If you relapse, it does not mean that treatment will not work. Follow these instructions at home:  Lifestyle  Do not use any products that contain nicotine or tobacco, such as cigarettes and e-cigarettes.  Avoid things that trigger  tobacco use as much as you can. Triggers include people and situations that usually cause you to use tobacco.  Avoid drinks that contain caffeine, including coffee. These may worsen some withdrawal symptoms.  Find ways to manage stress. Wanting to  smoke may cause stress, and stress can make you want to smoke. Relaxation techniques such as deep breathing, meditation, and yoga may help.  Attend support groups as needed. These groups are an important part of long-term recovery for many people. General instructions  Take over-the-counter and prescription medicines only as told by your health care provider.  Check with your health care provider before taking any new prescription or over-the-counter medicines.  Decide on a friend, family member, or smoking quit-line (such as 1-800-QUIT-NOW in the [place] that you can call or text when you feel the urge to smoke or when you need help coping with cravings.  Keep all follow-up visits as told by your health care provider and therapist. This is important. Contact a health care provider if:  You are not able to take your medicines as prescribed.  Your symptoms get worse, even with treatment. Summary  Tobacco use disorder (TUD) occurs when a person craves, seeks, and uses tobacco regardless of the consequences.  This condition may be diagnosed based on your current and past tobacco use and a physical exam.  Many people are unable to quit on their own and need help. Recovery can be a long process.  The most effective treatment for TUD is usually a combination of medicine, talk therapy, and support groups. This information is not intended to replace advice given to you by your health care provider. Make sure you discuss any questions you have with your health care provider. Document Revised: 01/25/2017 Document Reviewed: 01/25/2017 Elsevier Patient Education  2020 [employer].

## 2019-12-17 NOTE — Patient Outreach (Addendum)
[facility]) Care Management  12/17/2019  [name] Todd Feb 13, 1946 [id number]   Telephone Screen  Referral Date: 12/17/2019 Referral Source: MD Office Referral Reason: "dementia, wgt loss-skipping meals, unable to cook, med mgmt-not taking BP med" Insurance: [employer] attempt # 1 to patient. Spoke with patient who was a & o. She denies any acute issues or concerns at present. RN CM reviewed and discussed referral source and reason. Patient adamant that she does not need assistance in above areas. She reports she does not go hungry and "eats when she wants to eat." She reports that she eats small meals and snacks throughout the day since she lives alone. She stated that she was able to prepare small,quick and simple meals on her own. RN CM discussed possible SW referral for MOWs or other resources. Patient states she did not feel like she needed it at this time because she is eating just fine. She voices that she manages her meds on her own and takes them directly from her bottles. She reports BP medication issue was resolved at MD office today and she knows what, how and when to take meds. She declined [facility] pharmacy referral for possible med assistance. Patient appreciative of call but reported she did not wish for [facility] assistance at this time. She was provided with instructions on how to reach office in the event she changes her mind.      Plan: RN CM will close case at this time. RN CM will send MD case closure letter.    Antionette Fairy, RN,BSN,CCM [facility] Care Management Telephonic Care Management Coordinator Direct Phone: [phone] Toll Free: [phone] Fax: [phone]

## 2019-12-17 NOTE — Progress Notes (Signed)
clonidine

## 2019-12-18 LAB — LIPID PANEL
Cholesterol: 207 mg/dL — ABNORMAL HIGH (ref <200)
HDL: 80 mg/dL (ref >=50)
LDL Cholesterol (Calc): 109 mg/dL (calc) — ABNORMAL HIGH
Non-HDL Cholesterol (Calc): 127 mg/dL (calc) (ref <130)
Total CHOL/HDL Ratio: 2.6 (calc) (ref <5.0)
Triglycerides: 88 mg/dL (ref <150)

## 2019-12-18 LAB — CBC WITH DIFFERENTIAL/PLATELET
Absolute Monocytes: 403 cells/uL (ref 200–950)
Basophils Absolute: 30 cells/uL (ref 0–200)
Basophils Relative: 0.9 %
Eosinophils Absolute: 59 cells/uL (ref 15–500)
Eosinophils Relative: 1.8 %
HCT: 41 % (ref 35.0–45.0)
Hemoglobin: 14.1 g/dL (ref 11.7–15.5)
Lymphs Abs: 937 cells/uL (ref 850–3900)
MCH: 32 pg (ref 27.0–33.0)
MCHC: 34.4 g/dL (ref 32.0–36.0)
MCV: 93.2 fL (ref 80.0–100.0)
MPV: 9.9 fL (ref 7.5–12.5)
Monocytes Relative: 12.2 %
Neutro Abs: 1871 cells/uL (ref 1500–7800)
Neutrophils Relative %: 56.7 %
Platelets: 213 10*3/uL (ref 140–400)
RBC: 4.4 10*6/uL (ref 3.80–5.10)
RDW: 13.2 % (ref 11.0–15.0)
Total Lymphocyte: 28.4 %
WBC: 3.3 10*3/uL — ABNORMAL LOW (ref 3.8–10.8)

## 2019-12-18 LAB — TSH: TSH: 1.18 mIU/L (ref 0.40–4.50)

## 2019-12-18 LAB — COMPLETE METABOLIC PANEL WITH GFR
AG Ratio: 1.4 (calc) (ref 1.0–2.5)
ALT: 13 U/L (ref 6–29)
AST: 19 U/L (ref 10–35)
Albumin: 4.2 g/dL (ref 3.6–5.1)
Alkaline phosphatase (APISO): 56 U/L (ref 37–153)
BUN/Creatinine Ratio: 14 (calc) (ref 6–22)
BUN: 17 mg/dL (ref 7–25)
CO2: 25 mmol/L (ref 20–32)
Calcium: 10 mg/dL (ref 8.6–10.4)
Chloride: 102 mmol/L (ref 98–110)
Creat: 1.18 mg/dL — ABNORMAL HIGH (ref 0.60–0.93)
GFR, Est African American: 53 mL/min/{1.73_m2} — ABNORMAL LOW (ref >=60)
GFR, Est Non African American: 46 mL/min/{1.73_m2} — ABNORMAL LOW (ref >=60)
Globulin: 3.1 g/dL (calc) (ref 1.9–3.7)
Glucose, Bld: 97 mg/dL (ref 65–99)
Potassium: 4.5 mmol/L (ref 3.5–5.3)
Sodium: 137 mmol/L (ref 135–146)
Total Bilirubin: 0.4 mg/dL (ref 0.2–1.2)
Total Protein: 7.3 g/dL (ref 6.1–8.1)

## 2019-12-26 ENCOUNTER — Telehealth: Payer: Self-pay | Admitting: Family

## 2019-12-26 NOTE — Telephone Encounter (Signed)
Andrey Campanile called about the [id number] for [employer], and Andrey Campanile is going to come by and pick up the paperwork, I told her we have sent it twice to [employer] [facility] and they still have not received it so she is just going to come and get it.

## 2019-12-31 ENCOUNTER — Ambulatory Visit: Payer: Medicare HMO | Admitting: Family

## 2020-01-07 ENCOUNTER — Ambulatory Visit: Payer: Medicare HMO | Admitting: Family

## 2020-01-22 ENCOUNTER — Telehealth: Payer: Self-pay

## 2020-01-22 NOTE — Telephone Encounter (Signed)
Niece sandy called wanting lab results. Dhe requested that they be faxed to [phone]. Lab results faxed.

## 2020-04-27 ENCOUNTER — Telehealth: Payer: Self-pay | Admitting: Family

## 2020-04-27 NOTE — Telephone Encounter (Signed)
Called the patient and left a vm to call back and schedule an AWV 

## 2020-05-18 ENCOUNTER — Ambulatory Visit (INDEPENDENT_AMBULATORY_CARE_PROVIDER_SITE_OTHER): Payer: Medicare HMO | Admitting: Family

## 2020-05-18 ENCOUNTER — Other Ambulatory Visit: Payer: Self-pay

## 2020-05-18 ENCOUNTER — Encounter: Payer: Self-pay | Admitting: Family

## 2020-05-18 VITALS — BP 128/90 | HR 71 | Temp 97.7°F | Resp 16 | Ht 64.0 in | Wt 96.8 lb

## 2020-05-18 DIAGNOSIS — R636 Underweight: Secondary | ICD-10-CM | POA: Diagnosis not present

## 2020-05-18 DIAGNOSIS — E785 Hyperlipidemia, unspecified: Secondary | ICD-10-CM

## 2020-05-18 DIAGNOSIS — Z681 Body mass index (BMI) 19 or less, adult: Secondary | ICD-10-CM

## 2020-05-18 DIAGNOSIS — R634 Abnormal weight loss: Secondary | ICD-10-CM | POA: Diagnosis not present

## 2020-05-18 DIAGNOSIS — Z23 Encounter for immunization: Secondary | ICD-10-CM | POA: Diagnosis not present

## 2020-05-18 DIAGNOSIS — I10 Essential (primary) hypertension: Secondary | ICD-10-CM | POA: Diagnosis not present

## 2020-05-18 MED ORDER — ENSURE ACTIVE HIGH PROTEIN PO LIQD: 1.0000 | Freq: Every day | ORAL | 5 refills | Status: DC

## 2020-05-18 MED ORDER — TETANUS-DIPHTH-ACELL PERTUSSIS 5-2.5-18.5 LF-MCG/0.5 IM SUSP: 0.5000 mL | Freq: Once | INTRAMUSCULAR | 0 refills | Status: AC

## 2020-05-18 MED ORDER — ATORVASTATIN CALCIUM 20 MG PO TABS
20.0000 mg | ORAL_TABLET | Freq: Every day | ORAL | 3 refills | Status: DC
Start: 2020-05-18 — End: 2020-11-12

## 2020-05-18 MED ORDER — METOPROLOL SUCCINATE ER 25 MG PO TB24: 25.0000 mg | ORAL_TABLET | Freq: Every day | ORAL | 3 refills | Status: DC

## 2020-05-18 MED ORDER — LISINOPRIL 20 MG PO TABS: 20.0000 mg | ORAL_TABLET | Freq: Every day | ORAL | 3 refills | Status: DC

## 2020-05-18 NOTE — Patient Instructions (Signed)
-   Please drink Ensure one can daily between meals

## 2020-05-18 NOTE — Progress Notes (Signed)
Provider: Marlowe Sax FNP-C   Deamonte Sayegh, Nelda Bucks, NP  Patient Care Team: Tye Vigo, Nelda Bucks, NP as PCP - General (Family Medicine)  Extended Emergency Contact Information Primary Emergency Contact: Archie Patten Mobile Phone: [phone] Relation: Niece Secondary Emergency Contact: Hines,Pamela Address: [place], [place] Phone: [phone] Relation: Sister  Code Status: Full Code  Goals of care: Advanced Directive information Advanced Directives 05/18/2020  Does Patient Have a Medical Advance Directive? No  Would patient like information on creating a medical advance directive? No - Patient declined     Chief Complaint  Patient presents with  . Medical Management of Chronic Issues    5 Month Follow up    HPI:  Pt is a 75 y.o. female seen today for 5 months follow up medical management of chronic diseases.she is here with her Niece.Has been doing well no acute issues today. Has had no recent fall episodes.States eats sometimes but skips meals and snacks sometimes.Has had a 4 lbs weight loss since last seen.  Request medication refill though not sure whether the rest of medication needs refill.Niece called care giver and confirmed.  She continues to live by herself and has a Scientist, research (medical) who comes x 5 times per week to assist with her ADL's.States can shower by herself.      Past Medical History:  Diagnosis Date  . Breast mass   . Cognitive impairment   . Coronary artery disease    s/p MI and CABG in 2014  . Dysmenorrhea   . Endometriosis   . Estrogen deficiency   . Heart attack ([place])   . Hypertension   . Stroke [facility])    old left frontal infarct seen on CT scan 02/2017  . Tobacco abuse    Past Surgical History:  Procedure Laterality Date  . ABDOMINAL HYSTERECTOMY    . BACK SURGERY    . CORONARY ARTERY BYPASS GRAFT N/A 09/24/2012   Procedure: CORONARY ARTERY BYPASS GRAFTING (CABG);  Surgeon:  Gaye Pollack, MD;  Location: [place];  Service: Open Heart Surgery;  Laterality: N/A;  . ENDOVEIN HARVEST OF GREATER SAPHENOUS VEIN Right 09/24/2012   Procedure: ENDOVEIN HARVEST OF GREATER SAPHENOUS VEIN;  Surgeon: Gaye Pollack, MD;  Location: [place];  Service: Open Heart Surgery;  Laterality: Right;  . TEE WITHOUT CARDIOVERSION N/A 09/24/2012   Procedure: TRANSESOPHAGEAL ECHOCARDIOGRAM (TEE);  Surgeon: Gaye Pollack, MD;  Location: [place];  Service: Open Heart Surgery;  Laterality: N/A;    No Known Allergies  Allergies as of 05/18/2020   No Known Allergies     Medication List       Accurate as of May 18, 2020  4:14 PM. If you have any questions, ask your nurse or doctor.        atorvastatin 20 MG tablet Commonly known as: LIPITOR Take 1 tablet (20 mg total) by mouth daily.   lisinopril 20 MG tablet Commonly known as: ZESTRIL Take 1 tablet (20 mg total) by mouth daily.   metoprolol succinate 25 MG 24 hr tablet Commonly known as: TOPROL-XL Take 1 tablet (25 mg total) by mouth daily.       Review of Systems  Constitutional: Positive for unexpected weight change. Negative for appetite change, chills, fatigue and fever.  HENT: Negative for congestion, hearing loss, postnasal drip, rhinorrhea, sinus pressure, sinus pain, sneezing and sore throat.   Eyes: Negative for  discharge, redness and itching.  Respiratory: Negative for cough, chest tightness, shortness of breath and wheezing.   Cardiovascular: Negative for chest pain, palpitations and leg swelling.  Gastrointestinal: Negative for abdominal distention, abdominal pain, constipation, diarrhea, nausea and vomiting.  Endocrine: Negative for cold intolerance, heat intolerance, polydipsia and polyphagia.  Genitourinary: Negative for difficulty urinating, dysuria, flank pain, frequency and urgency.  Musculoskeletal: Negative for arthralgias, back pain, gait problem, joint swelling and myalgias.  Skin: Negative for color change,  pallor and rash.  Neurological: Negative for dizziness, speech difficulty, weakness, light-headedness, numbness and headaches.  Hematological: Does not bruise/bleed easily.  Psychiatric/Behavioral: Negative for agitation, behavioral problems, confusion and sleep disturbance. The patient is not nervous/anxious.     Immunization History  Administered Date(s) Administered  . Fluad Quad(high Dose 65+) 12/17/2019   Pertinent  Health Maintenance Due  Topic Date Due  . MAMMOGRAM  09/26/2020 (Originally 11/17/1996)  . DEXA SCAN  09/26/2020 (Originally 11/18/2011)  . COLONOSCOPY (Pts 45-65yr Insurance coverage will need to be confirmed)  09/26/2020 (Originally 11/18/1991)  . PNA vac Low Risk Adult (1 of 2 - PCV13) 09/26/2020 (Originally 11/18/2011)  . INFLUENZA VACCINE  Completed   Fall Risk  05/18/2020 12/17/2019 09/27/2019 04/16/2018 04/02/2018  Falls in the past year? 0 0 0 0 0  Number falls in past yr: 0 0 0 - -  Injury with Fall? 0 0 0 - -   Functional Status Survey:    Vitals:   05/18/20 1554  BP: 128/90  Pulse: 71  Resp: 16  Temp: 97.7 F (36.5 C)  SpO2: 96%  Weight: 96 lb 12.8 oz (43.9 kg)  Height: '5\' 4"'  (1.626 m)   Body mass index is 16.62 kg/m. Physical Exam Vitals reviewed.  Constitutional:      General: She is not in acute distress.    Appearance: She is underweight. She is not ill-appearing.  HENT:     Head: Normocephalic.     Right Ear: Tympanic membrane, ear canal and external ear normal. There is no impacted cerumen.     Left Ear: Tympanic membrane, ear canal and external ear normal. There is no impacted cerumen.     Nose: Nose normal. No congestion or rhinorrhea.     Mouth/Throat:     Mouth: Mucous membranes are moist.     Pharynx: Oropharynx is clear. No oropharyngeal exudate or posterior oropharyngeal erythema.  Eyes:     General: No scleral icterus.       Right eye: No discharge.        Left eye: No discharge.     Extraocular Movements: Extraocular movements  intact.     Conjunctiva/sclera: Conjunctivae normal.     Pupils: Pupils are equal, round, and reactive to light.  Neck:     Vascular: No carotid bruit.  Cardiovascular:     Rate and Rhythm: Normal rate and regular rhythm.     Pulses: Normal pulses.     Heart sounds: Normal heart sounds. No murmur heard. No friction rub. No gallop.   Pulmonary:     Effort: Pulmonary effort is normal. No respiratory distress.     Breath sounds: Normal breath sounds. No wheezing, rhonchi or rales.  Chest:     Chest wall: No tenderness.  Abdominal:     General: Bowel sounds are normal. There is no distension.     Palpations: Abdomen is soft. There is no mass.     Tenderness: There is no abdominal tenderness. There is no right CVA tenderness,  left CVA tenderness, guarding or rebound.  Musculoskeletal:        General: No swelling or tenderness. Normal range of motion.     Cervical back: Normal range of motion. No rigidity or tenderness.     Right lower leg: No edema.     Left lower leg: No edema.  Lymphadenopathy:     Cervical: No cervical adenopathy.  Skin:    General: Skin is warm and dry.     Coloration: Skin is not pale.     Findings: No bruising, erythema or rash.  Neurological:     Mental Status: She is alert and oriented to person, place, and time.     Cranial Nerves: No cranial nerve deficit.     Sensory: No sensory deficit.     Motor: No weakness.     Coordination: Coordination normal.     Gait: Gait normal.  Psychiatric:        Mood and Affect: Mood normal.        Speech: Speech normal.        Behavior: Behavior normal.        Thought Content: Thought content normal.        Cognition and Memory: Memory is impaired.        Judgment: Judgment normal.     Labs reviewed: Recent Labs    12/17/19 0929  NA 137  K 4.5  CL 102  CO2 25  GLUCOSE 97  BUN 17  CREATININE 1.18*  CALCIUM 10.0   Recent Labs    12/17/19 0929  AST 19  ALT 13  BILITOT 0.4  PROT 7.3   Recent Labs     12/17/19 0929  WBC 3.3*  NEUTROABS 1,871  HGB 14.1  HCT 41.0  MCV 93.2  PLT 213   Lab Results  Component Value Date   TSH 1.18 12/17/2019   Lab Results  Component Value Date   HGBA1C 5.7 (H) 09/24/2012   Lab Results  Component Value Date   CHOL 207 (H) 12/17/2019   HDL 80 12/17/2019   LDLCALC 109 (H) 12/17/2019   TRIG 88 12/17/2019   CHOLHDL 2.6 12/17/2019    Significant Diagnostic Results in last 30 days:  No results found.  Assessment/Plan 1. Essential hypertension B/p stable. Request medication refill though not sure of her medication niece called care giver was able to confirm need all meds refilled.  - CBC with Differential/Platelet - CMP with eGFR([facility]) - TSH; Future - metoprolol succinate (TOPROL-XL) 25 MG 24 hr tablet; Take 1 tablet (25 mg total) by mouth daily.  Dispense: 30 tablet; Refill: 3  2. Hyperlipidemia LDL goal <100 Previous LDL 109  Has not had anything to eat today.will check fasting lab  - Lipid Panel - atorvastatin (LIPITOR) 20 MG tablet; Take 1 tablet (20 mg total) by mouth daily.  Dispense: 30 tablet; Refill: 3  3. Weight loss Has had 4 lbs weight loss over 5 months.skips meals sometimes. Advised to drink Ensure one can by mouth daily between meals.  - CMP with eGFR([facility]) - Nutritional Supplements (ENSURE ACTIVE HIGH PROTEIN) LIQD; Take 1 Can by mouth daily.  Dispense: 240 mL; Refill: 5 - atorvastatin (LIPITOR) 20 MG tablet; Take 1 tablet (20 mg total) by mouth daily.  Dispense: 30 tablet; Refill: 3  6. Underweight Has had a 4 lbs weight loss since last seen 5 months ago. - Advised to drink Ensure one daily between meals.  7. Body mass index (BMI) of 19 or less  in adult BMI 16.62  Protein supplement as above and avoid skipping meals.will consider Remeron if weight loss persist.    Family/ staff Communication: Reviewed plan of care with patient and Niece  Labs/tests ordered:  - CBC with Differential/Platelet - CMP with  eGFR([facility]) - TSH; Future - Lipid Panel  Next Appointment : 6 months for medical management of chronic issues.   Sandrea Hughs, NP

## 2020-05-19 LAB — CBC WITH DIFFERENTIAL/PLATELET
Absolute Monocytes: 419 cells/uL (ref 200–950)
Basophils Absolute: 50 cells/uL (ref 0–200)
Basophils Relative: 1.1 %
Eosinophils Absolute: 50 cells/uL (ref 15–500)
Eosinophils Relative: 1.1 %
HCT: 41.9 % (ref 35.0–45.0)
Hemoglobin: 14.2 g/dL (ref 11.7–15.5)
Lymphs Abs: 1409 cells/uL (ref 850–3900)
MCH: 31.3 pg (ref 27.0–33.0)
MCHC: 33.9 g/dL (ref 32.0–36.0)
MCV: 92.3 fL (ref 80.0–100.0)
MPV: 9.8 fL (ref 7.5–12.5)
Monocytes Relative: 9.3 %
Neutro Abs: 2574 cells/uL (ref 1500–7800)
Neutrophils Relative %: 57.2 %
Platelets: 238 10*3/uL (ref 140–400)
RBC: 4.54 10*6/uL (ref 3.80–5.10)
RDW: 13.1 % (ref 11.0–15.0)
Total Lymphocyte: 31.3 %
WBC: 4.5 10*3/uL (ref 3.8–10.8)

## 2020-05-19 LAB — COMPLETE METABOLIC PANEL WITH GFR
AG Ratio: 1.3 (calc) (ref 1.0–2.5)
ALT: 12 U/L (ref 6–29)
AST: 22 U/L (ref 10–35)
Albumin: 4.6 g/dL (ref 3.6–5.1)
Alkaline phosphatase (APISO): 63 U/L (ref 37–153)
BUN/Creatinine Ratio: 13 (calc) (ref 6–22)
BUN: 13 mg/dL (ref 7–25)
CO2: 25 mmol/L (ref 20–32)
Calcium: 10.2 mg/dL (ref 8.6–10.4)
Chloride: 103 mmol/L (ref 98–110)
Creat: 0.97 mg/dL — ABNORMAL HIGH (ref 0.60–0.93)
GFR, Est African American: 67 mL/min/{1.73_m2} (ref >=60)
GFR, Est Non African American: 58 mL/min/{1.73_m2} — ABNORMAL LOW (ref >=60)
Globulin: 3.5 g/dL (calc) (ref 1.9–3.7)
Glucose, Bld: 79 mg/dL (ref 65–139)
Potassium: 3.9 mmol/L (ref 3.5–5.3)
Sodium: 138 mmol/L (ref 135–146)
Total Bilirubin: 0.5 mg/dL (ref 0.2–1.2)
Total Protein: 8.1 g/dL (ref 6.1–8.1)

## 2020-05-19 LAB — LIPID PANEL
Cholesterol: 182 mg/dL (ref <200)
HDL: 77 mg/dL (ref >=50)
LDL Cholesterol (Calc): 88 mg/dL (calc)
Non-HDL Cholesterol (Calc): 105 mg/dL (calc) (ref <130)
Total CHOL/HDL Ratio: 2.4 (calc) (ref <5.0)
Triglycerides: 84 mg/dL (ref <150)

## 2020-05-21 ENCOUNTER — Other Ambulatory Visit: Payer: Self-pay

## 2020-05-21 DIAGNOSIS — I10 Essential (primary) hypertension: Secondary | ICD-10-CM

## 2020-05-21 DIAGNOSIS — E785 Hyperlipidemia, unspecified: Secondary | ICD-10-CM

## 2020-05-21 DIAGNOSIS — R634 Abnormal weight loss: Secondary | ICD-10-CM

## 2020-08-11 ENCOUNTER — Other Ambulatory Visit: Payer: Self-pay

## 2020-08-11 ENCOUNTER — Ambulatory Visit (INDEPENDENT_AMBULATORY_CARE_PROVIDER_SITE_OTHER): Payer: Medicare HMO | Admitting: Family

## 2020-08-11 ENCOUNTER — Encounter: Payer: Self-pay | Admitting: Family

## 2020-08-11 VITALS — BP 152/96 | HR 88 | Temp 98.6°F | Ht 64.0 in | Wt 97.8 lb

## 2020-08-11 DIAGNOSIS — E785 Hyperlipidemia, unspecified: Secondary | ICD-10-CM

## 2020-08-11 DIAGNOSIS — L602 Onychogryphosis: Secondary | ICD-10-CM

## 2020-08-11 DIAGNOSIS — I10 Essential (primary) hypertension: Secondary | ICD-10-CM

## 2020-08-11 DIAGNOSIS — L84 Corns and callosities: Secondary | ICD-10-CM | POA: Diagnosis not present

## 2020-08-11 DIAGNOSIS — Z23 Encounter for immunization: Secondary | ICD-10-CM | POA: Diagnosis not present

## 2020-08-11 MED ORDER — TETANUS-DIPHTH-ACELL PERTUSSIS 5-2.5-18.5 LF-MCG/0.5 IM SUSP: 0.5000 mL | Freq: Once | INTRAMUSCULAR | 0 refills | Status: AC

## 2020-08-11 NOTE — Progress Notes (Signed)
Location:      Place of Service:    Provider: Paddy Neis FNP-C   Paden Senger, Donalee Citrin, NP  Patient Care Team: Dontez Hauss, Donalee Citrin, NP as PCP - General (Family Medicine)  Extended Emergency Contact Information Primary Emergency Contact: Hervey Ard Mobile Phone: [phone] Relation: Niece Secondary Emergency Contact: Hines,Pamela Address: [address]          [place], [place] Home Phone: [phone] Relation: Sister  Code Status:  Full Code  Goals of care: Advanced Directive information Advanced Directives 08/11/2020  Does Patient Have a Medical Advance Directive? No  Would patient like information on creating a medical advance directive? No - Patient declined     Chief Complaint  Patient presents with   Medical Management of Chronic Issues    Patient returns to the office for follow up.     HPI:  Pt is a 75 y.o. female seen today 3 month follow up for medical management of chronic diseases.she denies any acute issues today.she is here with daughter who provides additional HPI information due to patient's cognitive impairment.  Blood pressure slightly high today though states readings goes high once in a while when going out.Readings normal at home though didn't bring her log.   She due for her COVID-19 vaccine but declines states not planning to take any vaccine. Also declines shingles vaccine.advised to get at her pharmacy if she changes her mind.  Also advised to get her Tdap vaccine at the pharmacy script send today.script previously send to pharmacy but unclear why she didn't get it.seems relaxant with vaccine.   Unsteady gait - daughter reports her balance is worsening but patient declines.Not using any assistive device. No fall episode reported.   Cigarette smoking - continues to smoke states enjoys cigarette every once in a while does not smoke much.Not ready to quit.    Past Medical History:  Diagnosis Date   Breast mass     Cognitive impairment    Coronary artery disease    s/p MI and CABG in 2014   Dysmenorrhea    Endometriosis    Estrogen deficiency    Heart attack ([facility])    Hypertension    Stroke [facility])    old left frontal infarct seen on CT scan 02/2017   Tobacco abuse    Past Surgical History:  Procedure Laterality Date   ABDOMINAL HYSTERECTOMY     BACK SURGERY     CORONARY ARTERY BYPASS GRAFT N/A 09/24/2012   Procedure: CORONARY ARTERY BYPASS GRAFTING (CABG);  Surgeon: Alleen Borne, MD;  Location: [facility] OR;  Service: Open Heart Surgery;  Laterality: N/A;   ENDOVEIN HARVEST OF GREATER SAPHENOUS VEIN Right 09/24/2012   Procedure: ENDOVEIN HARVEST OF GREATER SAPHENOUS VEIN;  Surgeon: Alleen Borne, MD;  Location: [facility] OR;  Service: Open Heart Surgery;  Laterality: Right;   TEE WITHOUT CARDIOVERSION N/A 09/24/2012   Procedure: TRANSESOPHAGEAL ECHOCARDIOGRAM (TEE);  Surgeon: Alleen Borne, MD;  Location[facility] OR;  Service: Open Heart Surgery;  Laterality: N/A;    No Known Allergies  Allergies as of 08/11/2020   No Known Allergies      Medication List        Accurate as of August 11, 2020  1:16 PM. If you have any questions, ask your nurse or doctor.          atorvastatin 20 MG tablet Commonly known as: LIPITOR Take 1 tablet (20 mg total) by mouth daily.   donepezil  5 MG tablet Commonly known as: ARICEPT Take 5 mg by mouth at bedtime.   Ensure Active High Protein Liqd Take 1 Can by mouth daily.   lisinopril 20 MG tablet Commonly known as: ZESTRIL Take 1 tablet (20 mg total) by mouth daily.   metoprolol succinate 25 MG 24 hr tablet Commonly known as: TOPROL-XL Take 1 tablet (25 mg total) by mouth daily.   nitroGLYCERIN 0.4 mg/hr patch Commonly known as: NITRODUR - Dosed in mg/24 hr Place 0.4 mg onto the skin daily.        Review of Systems  Constitutional:  Negative for appetite change, chills, fatigue, fever and unexpected weight change.  HENT:  Negative for congestion, dental  problem, ear discharge, ear pain, facial swelling, hearing loss, nosebleeds, postnasal drip, rhinorrhea, sinus pressure, sinus pain, sneezing, sore throat, tinnitus and trouble swallowing.   Eyes:  Negative for pain, discharge, redness, itching and visual disturbance.  Respiratory:  Negative for cough, chest tightness, shortness of breath and wheezing.   Cardiovascular:  Negative for chest pain, palpitations and leg swelling.  Gastrointestinal:  Negative for abdominal distention, abdominal pain, blood in stool, constipation, diarrhea, nausea and vomiting.  Endocrine: Negative for cold intolerance, heat intolerance, polydipsia, polyphagia and polyuria.  Genitourinary:  Negative for difficulty urinating, dysuria, flank pain, frequency and urgency.  Musculoskeletal:  Negative for arthralgias, back pain, gait problem, joint swelling, myalgias, neck pain and neck stiffness.  Skin:  Negative for color change, pallor, rash and wound.  Neurological:  Negative for dizziness, syncope, speech difficulty, weakness, light-headedness, numbness and headaches.  Hematological:  Does not bruise/bleed easily.  Psychiatric/Behavioral:  Negative for agitation, behavioral problems, confusion, hallucinations, self-injury, sleep disturbance and suicidal ideas. The patient is not nervous/anxious.    Immunization History  Administered Date(s) Administered   Fluad Quad(high Dose 65+) 12/17/2019   Pertinent  Health Maintenance Due  Topic Date Due   MAMMOGRAM  09/26/2020 (Originally 11/17/1996)   DEXA SCAN  09/26/2020 (Originally 11/18/2011)   COLONOSCOPY (Pts 45-4573yrs Insurance coverage will need to be confirmed)  09/26/2020 (Originally 11/18/1991)   PNA vac Low Risk Adult (1 of 2 - PCV13) 09/26/2020 (Originally 11/18/2011)   INFLUENZA VACCINE  09/21/2020   Fall Risk  08/11/2020 05/18/2020 12/17/2019 09/27/2019 04/16/2018  Falls in the past year? 0 0 0 0 0  Number falls in past yr: 0 0 0 0 -  Injury with Fall? - 0 0 0 -    Functional Status Survey:    Vitals:   08/11/20 1308  BP: (!) 152/96  Pulse: 88  Temp: 98.6 F (37 C)  SpO2: 98%  Weight: 97 lb 12.8 oz (44.4 kg)  Height: 5\' 4"  (1.626 m)   Body mass index is 16.79 kg/m. Physical Exam Vitals reviewed.  Constitutional:      General: She is not in acute distress.    Appearance: Normal appearance. She is normal weight. She is not ill-appearing or diaphoretic.  HENT:     Head: Normocephalic.     Right Ear: Tympanic membrane, ear canal and external ear normal. There is no impacted cerumen.     Left Ear: Tympanic membrane, ear canal and external ear normal. There is no impacted cerumen.     Nose: Nose normal. No congestion or rhinorrhea.     Mouth/Throat:     Mouth: Mucous membranes are moist.     Pharynx: Oropharynx is clear. No oropharyngeal exudate or posterior oropharyngeal erythema.  Eyes:     General: No scleral icterus.  Right eye: No discharge.        Left eye: No discharge.     Extraocular Movements: Extraocular movements intact.     Conjunctiva/sclera: Conjunctivae normal.     Pupils: Pupils are equal, round, and reactive to light.  Neck:     Vascular: No carotid bruit.  Cardiovascular:     Rate and Rhythm: Normal rate and regular rhythm.     Pulses: Normal pulses.     Heart sounds: Normal heart sounds. No murmur heard.   No friction rub. No gallop.  Pulmonary:     Effort: Pulmonary effort is normal. No respiratory distress.     Breath sounds: Normal breath sounds. No wheezing, rhonchi or rales.  Chest:     Chest wall: No tenderness.  Abdominal:     General: Bowel sounds are normal. There is no distension.     Palpations: Abdomen is soft. There is no mass.     Tenderness: There is no abdominal tenderness. There is no right CVA tenderness, left CVA tenderness, guarding or rebound.  Musculoskeletal:        General: No swelling or tenderness. Normal range of motion.     Cervical back: Normal range of motion. No rigidity  or tenderness.     Right lower leg: No edema.     Left lower leg: No edema.  Feet:     Right foot:     Skin integrity: Skin integrity normal.     Toenail Condition: Right toenails are abnormally thick and long.     Left foot:     Skin integrity: Callus present. No ulcer, blister, skin breakdown, erythema or warmth.     Toenail Condition: Left toenails are abnormally thick and long.  Lymphadenopathy:     Cervical: No cervical adenopathy.  Skin:    General: Skin is warm and dry.     Coloration: Skin is not pale.     Findings: No bruising, erythema, lesion or rash.  Neurological:     Mental Status: She is alert and oriented to person, place, and time.     Cranial Nerves: No cranial nerve deficit.     Sensory: No sensory deficit.     Motor: No weakness.     Coordination: Coordination normal.     Gait: Gait normal.  Psychiatric:        Mood and Affect: Mood normal.        Speech: Speech normal.        Behavior: Behavior normal.        Thought Content: Thought content normal.        Cognition and Memory: Memory is impaired.        Judgment: Judgment normal.    Labs reviewed: Recent Labs    12/17/19 0929 05/18/20 1641  NA 137 138  K 4.5 3.9  CL 102 103  CO2 25 25  GLUCOSE 97 79  BUN 17 13  CREATININE 1.18* 0.97*  CALCIUM 10.0 10.2   Recent Labs    12/17/19 0929 05/18/20 1641  AST 19 22  ALT 13 12  BILITOT 0.4 0.5  PROT 7.3 8.1   Recent Labs    12/17/19 0929 05/18/20 1641  WBC 3.3* 4.5  NEUTROABS 1,871 2,574  HGB 14.1 14.2  HCT 41.0 41.9  MCV 93.2 92.3  PLT 213 238   Lab Results  Component Value Date   TSH 1.18 12/17/2019   Lab Results  Component Value Date   HGBA1C 5.7 (H) 09/24/2012  Lab Results  Component Value Date   CHOL 182 05/18/2020   HDL 77 05/18/2020   LDLCALC 88 05/18/2020   TRIG 84 05/18/2020   CHOLHDL 2.4 05/18/2020    Significant Diagnostic Results in last 30 days:  No results found.  Assessment/Plan 1. Essential  hypertension B/p slightly elevated this visit previous and home readings are normal. - continue on lisinopril and metoprolol   2. Hyperlipidemia LDL goal <100 LDL at goal - continue - Dietary modification and exercise at least 3 times per week for 30 minutes advised. - continue on Atorvastatin    3. Need for Tdap vaccination Advised to get Tdap vaccine at her pharmacy - Tdap (BOOSTRIX) 5-2.5-18.5 LF-MCG/0.5 injection; Inject 0.5 mLs into the muscle once for 1 dose.  Dispense: 0.5 mL; Refill: 0  4. Overgrown toenails Bilateral foot toenails thick and long.will refer to podiatrist made aware that  specialist office will call her for appointment  - Ambulatory referral to Podiatry  5. Callus of foot Left foot Planter area thick,long cone dry callus non-tender to touch. - Ambulatory referral to Podiatry  Family/ staff Communication: Reviewed plan of care with patient and daughter verbalized understanding   Labs/tests ordered: None   Next Appointment : Has appointment 10/2020   Caesar Bookman, NP

## 2020-08-26 ENCOUNTER — Other Ambulatory Visit: Payer: Self-pay

## 2020-08-26 ENCOUNTER — Ambulatory Visit (INDEPENDENT_AMBULATORY_CARE_PROVIDER_SITE_OTHER): Payer: Medicare HMO | Admitting: Podiatry

## 2020-08-26 DIAGNOSIS — M79674 Pain in right toe(s): Secondary | ICD-10-CM

## 2020-08-26 DIAGNOSIS — L989 Disorder of the skin and subcutaneous tissue, unspecified: Secondary | ICD-10-CM | POA: Diagnosis not present

## 2020-08-26 DIAGNOSIS — B351 Tinea unguium: Secondary | ICD-10-CM

## 2020-08-26 DIAGNOSIS — M79675 Pain in left toe(s): Secondary | ICD-10-CM | POA: Diagnosis not present

## 2020-08-26 NOTE — Progress Notes (Signed)
    Subjective: Patient is a 75 y.o. female presenting to the office today with a chief complaint of painful callus lesion(s) noted to the right forefoot has been present for several months.  She has not anything for treatment but it is very symptomatic and painful with walking Patient also complains of elongated, thickened nails that cause pain while ambulating in shoes.  She is unable to trim her own nails. Patient presents today for further treatment and evaluation.  Past Medical History:  Diagnosis Date   Breast mass    Cognitive impairment    Coronary artery disease    s/p MI and CABG in 2014   Dysmenorrhea    Endometriosis    Estrogen deficiency    Heart attack ([facility])    Hypertension    Stroke [facility])    old left frontal infarct seen on CT scan 02/2017   Tobacco abuse     Objective:  Physical Exam General: Alert and oriented x3 in no acute distress  Dermatology: Hyperkeratotic lesion(s) present on the right forefoot. Pain on palpation with a central nucleated core noted. Skin is warm, dry and supple bilateral lower extremities. Negative for open lesions or macerations. Nails are tender, long, thickened and dystrophic with subungual debris, consistent with onychomycosis, 1-5 bilateral. No signs of infection noted.  Vascular: Palpable pedal pulses bilaterally. No edema or erythema noted. Capillary refill within normal limits.  Neurological: Epicritic and protective threshold grossly intact bilaterally.   Musculoskeletal Exam: Pain on palpation at the keratotic lesion(s) noted. Range of motion within normal limits bilateral. Muscle strength 5/5 in all groups bilateral.  Assessment: 1. Onychodystrophic nails 1-5 bilateral with hyperkeratosis of nails.  2. Onychomycosis of nail due to dermatophyte bilateral 3.  Preulcerative callus lesions/porokeratosis to the right forefoot multiple   Plan of Care:  1. Patient evaluated. 2. Excisional debridement of keratoic lesion(s) using a  chisel blade was performed without incident.  3. Dressed with light dressing. 4. Mechanical debridement of nails 1-5 bilaterally performed using a nail nipper. Filed with dremel without incident.  5. Patient is to return to the clinic in 3 months.    *Niece's name is Gilmer Mor, DPM [facility]  Dr. Felecia Shelling, DPM    [address] [place], [place] [address]                Office [phone]  Fax [phone]

## 2020-09-22 ENCOUNTER — Other Ambulatory Visit: Payer: Self-pay | Admitting: Family

## 2020-09-22 DIAGNOSIS — I10 Essential (primary) hypertension: Secondary | ICD-10-CM

## 2020-10-21 ENCOUNTER — Telehealth: Payer: Self-pay | Admitting: Family

## 2020-10-21 NOTE — Telephone Encounter (Signed)
I called the patient and left a vm to call our office and schedule an awv

## 2020-11-12 ENCOUNTER — Encounter: Payer: Self-pay | Admitting: Family

## 2020-11-12 ENCOUNTER — Ambulatory Visit (INDEPENDENT_AMBULATORY_CARE_PROVIDER_SITE_OTHER): Payer: Medicare HMO | Admitting: Family

## 2020-11-12 ENCOUNTER — Other Ambulatory Visit: Payer: Self-pay

## 2020-11-12 DIAGNOSIS — Z0000 Encounter for general adult medical examination without abnormal findings: Secondary | ICD-10-CM

## 2020-11-12 DIAGNOSIS — Z23 Encounter for immunization: Secondary | ICD-10-CM | POA: Diagnosis not present

## 2020-11-12 DIAGNOSIS — I10 Essential (primary) hypertension: Secondary | ICD-10-CM | POA: Diagnosis not present

## 2020-11-12 DIAGNOSIS — E785 Hyperlipidemia, unspecified: Secondary | ICD-10-CM | POA: Diagnosis not present

## 2020-11-12 DIAGNOSIS — Z1231 Encounter for screening mammogram for malignant neoplasm of breast: Secondary | ICD-10-CM | POA: Diagnosis not present

## 2020-11-12 DIAGNOSIS — Z780 Asymptomatic menopausal state: Secondary | ICD-10-CM | POA: Diagnosis not present

## 2020-11-12 DIAGNOSIS — R634 Abnormal weight loss: Secondary | ICD-10-CM

## 2020-11-12 MED ORDER — TETANUS-DIPHTH-ACELL PERTUSSIS 5-2.5-18.5 LF-MCG/0.5 IM SUSP: 0.5000 mL | Freq: Once | INTRAMUSCULAR | 0 refills | Status: AC

## 2020-11-12 MED ORDER — TETANUS-DIPHTH-ACELL PERTUSSIS 5-2.5-18.5 LF-MCG/0.5 IM SUSP: 0.5000 mL | Freq: Once | INTRAMUSCULAR | 0 refills | Status: DC

## 2020-11-12 MED ORDER — LISINOPRIL 20 MG PO TABS: 20.0000 mg | ORAL_TABLET | Freq: Every day | ORAL | 3 refills | Status: AC

## 2020-11-12 MED ORDER — METOPROLOL SUCCINATE ER 25 MG PO TB24: 25.0000 mg | ORAL_TABLET | Freq: Every day | ORAL | 1 refills | Status: AC

## 2020-11-12 MED ORDER — DONEPEZIL HCL 5 MG PO TABS: 5.0000 mg | ORAL_TABLET | Freq: Every day | ORAL | 5 refills | Status: AC

## 2020-11-12 MED ORDER — ATORVASTATIN CALCIUM 20 MG PO TABS: 20.0000 mg | ORAL_TABLET | Freq: Every day | ORAL | 3 refills | Status: DC

## 2020-11-12 NOTE — Progress Notes (Signed)
Subjective:  This service is provided via telemedicine  No vital signs collected/recorded due to the encounter was a telemedicine visit.   Location of patient (ex: home, work):  Home  Patient consents to a telephone visit:  Yes  Location of the provider (ex: office, home):  Office  Name of any referring provider:  Raenette Sakata, Donalee Citrin, NP   Names of all persons participating in the telemedicine service and their role in the encounter:  Charlann Wayne, Judithann Sauger McClurkin,CMA and Clee Pandit,NP  Time spent on call:  10 minutes   Tereka Thorley is a 75 y.o. female who presents for a Welcome to Medicare exam.   Review of Systems  Cardiac Risk Factors include: hypertension;dyslipidemia;advanced age (>17men, >24 women);sedentary lifestyle;smoking/ tobacco exposure      Objective:    There were no vitals filed for this visit.There is no height or weight on file to calculate BMI.  Medications Outpatient Encounter Medications as of 11/12/2020  Medication Sig   atorvastatin (LIPITOR) 20 MG tablet Take 1 tablet (20 mg total) by mouth daily.   donepezil (ARICEPT) 5 MG tablet Take 5 mg by mouth at bedtime.   lisinopril (ZESTRIL) 20 MG tablet Take 1 tablet (20 mg total) by mouth daily.   metoprolol succinate (TOPROL-XL) 25 MG 24 hr tablet Take 1 tablet by mouth once daily   nitroGLYCERIN (NITRODUR - DOSED IN MG/24 HR) 0.4 mg/hr patch Place 0.4 mg onto the skin daily.   [DISCONTINUED] Nutritional Supplements (ENSURE ACTIVE HIGH PROTEIN) LIQD Take 1 Can by mouth daily. (Patient not taking: Reported on 08/11/2020)   No facility-administered encounter medications on file as of 11/12/2020.     History: Past Medical History:  Diagnosis Date   Breast mass    Cognitive impairment    Coronary artery disease    s/p MI and CABG in 2014   Dysmenorrhea    Endometriosis    Estrogen deficiency    Heart attack (HCC)    Hypertension    Stroke [facility])    old left frontal infarct seen on CT scan 02/2017    Tobacco abuse    Past Surgical History:  Procedure Laterality Date   ABDOMINAL HYSTERECTOMY     BACK SURGERY     CORONARY ARTERY BYPASS GRAFT N/A 09/24/2012   Procedure: CORONARY ARTERY BYPASS GRAFTING (CABG);  Surgeon: Alleen Borne, MD;  Location: [facility] OR;  Service: Open Heart Surgery;  Laterality: N/A;   ENDOVEIN HARVEST OF GREATER SAPHENOUS VEIN Right 09/24/2012   Procedure: ENDOVEIN HARVEST OF GREATER SAPHENOUS VEIN;  Surgeon: Alleen Borne, MD;  Location: [facility] OR;  Service: Open Heart Surgery;  Laterality: Right;   TEE WITHOUT CARDIOVERSION N/A 09/24/2012   Procedure: TRANSESOPHAGEAL ECHOCARDIOGRAM (TEE);  Surgeon: Alleen Borne, MD;  Location: [facility] OR;  Service: Open Heart Surgery;  Laterality: N/A;    Family History  Problem Relation Age of Onset   Diabetes Maternal Grandmother    Coronary artery disease Sister    Social History   Occupational History   Occupation: CNA    Comment: Retired  Tobacco Use   Smoking status: Every Day    Packs/day: 0.50    Types: Cigarettes   Smokeless tobacco: Never  Substance and Sexual Activity   Alcohol use: No   Drug use: No   Sexual activity: Not Currently    Birth control/protection: Surgical    Comment: hyst    Tobacco Counseling Ready to quit: Not Answered Counseling given: Not Answered   Immunizations  and Health Maintenance Immunization History  Administered Date(s) Administered   Fluad Quad(high Dose 65+) 12/17/2019   Health Maintenance Due  Topic Date Due   TETANUS/TDAP  Never done   COLONOSCOPY (Pts 45-74yrs Insurance coverage will need to be confirmed)  Never done   MAMMOGRAM  Never done   Zoster Vaccines- Shingrix (1 of 2) Never done   DEXA SCAN  Never done   INFLUENZA VACCINE  09/21/2020    Activities of Daily Living In your present state of health, do you have any difficulty performing the following activities: 11/12/2020 11/12/2020  Hearing? N N  Vision? N N  Difficulty concentrating or making decisions? Y Y   Comment memory loss -  Walking or climbing stairs? N N  Dressing or bathing? N N  Doing errands, shopping? [place]  Comment daughter assis t -  Quarry manager and eating ? N -  Using the Toilet? N -  In the past six months, have you accidently leaked urine? N -  Do you have problems with loss of bowel control? N -  Managing your Medications? N -  Managing your Finances? N -  Housekeeping or managing your Housekeeping? N -  Some recent data might be hidden    Physical Exam  (optional), or other factors deemed appropriate based on the beneficiary's medical and social history and current clinical standards.  Advanced Directives:      Assessment:    This is a routine wellness examination for this patient . Denies acute issues.  Vision/Hearing screen No results found.  Dietary issues and exercise activities discussed:  Current Exercise Habits: The patient does not participate in regular exercise at present, Exercise limited by: Other - see comments (memory loss)   Goals   None   Depression Screen PHQ 2/9 Scores 11/12/2020 12/17/2019 04/16/2018 04/02/2018  PHQ - 2 Score 0 0 0 2  PHQ- 9 Score - - 0 2     Fall Risk Fall Risk  11/12/2020  Falls in the past year? 0  Number falls in past yr: 0  Injury with Fall? 0  Risk for fall due to : History of fall(s)  Follow up Falls evaluation completed;Education provided;Falls prevention discussed    Cognitive Function: MMSE - Mini Mental State Exam 09/27/2019  Orientation to time 2  Orientation to time comments year 2021, season summer, date patient didn't know, day patient states thursday, month patient states July.  Orientation to Place 3  Orientation to Place-comments [place], [place], [place], Facility patient didn't know what the building was called.  Registration 3  Attention/ Calculation 0  Attention/Calculation-comments Patient could spell WORLD but couldnt spell it backwards.  Recall 0  Language- name 2  objects 2  Language- repeat 1  Language- follow 3 step command 3  Language- read & follow direction 1  Write a sentence 1  Copy design 0  Total score 16     6CIT Screen 11/12/2020  What Year? 4 points  What month? 0 points  What time? 0 points  Count back from 20 0 points  Months in reverse 4 points  Repeat phrase 0 points  Total Score 8    Patient Care Team: Caide Campi, Donalee Citrin, NP as PCP - General (Family Medicine)     Plan:   - Advised to get her Tetanus vaccine and shingrix vaccine at her pharmacy. - Schedule appointment for Influenza vaccine on 11/17/2020 her at the office when she comes for fasting labs.  -  mammogram  - Bone density  - Declined colonoscopy   I have personally reviewed and noted the following in the patient's chart:   Medical and social history Use of alcohol, tobacco or illicit drugs  Current medications and supplements Functional ability and status Nutritional status Physical activity Advanced directives List of other physicians Hospitalizations, surgeries, and ER visits in previous 12 months Vitals Screenings to include cognitive, depression, and falls Referrals and appointments  In addition, I have reviewed and discussed with patient certain preventive protocols, quality metrics, and best practice recommendations. A written personalized care plan for preventive services as well as general preventive health recommendations were provided to patient.     Caesar Bookman, NP 11/12/2020

## 2020-11-12 NOTE — Patient Instructions (Signed)
Health Maintenance, Female Adopting a healthy lifestyle and getting preventive care are important in promoting health and wellness. Ask your health care provider about: The right schedule for you to have regular tests and exams. Things you can do on your own to prevent diseases and keep yourself healthy. What should I know about diet, weight, and exercise? Eat a healthy diet  Eat a diet that includes plenty of vegetables, fruits, low-fat dairy products, and lean protein. Do not eat a lot of foods that are high in solid fats, added sugars, or sodium. Maintain a healthy weight Body mass index (BMI) is used to identify weight problems. It estimates body fat based on height and weight. Your health care provider can help determine your BMI and help you achieve or maintain a healthy weight. Get regular exercise Get regular exercise. This is one of the most important things you can do for your health. Most adults should: Exercise for at least 150 minutes each week. The exercise should increase your heart rate and make you sweat (moderate-intensity exercise). Do strengthening exercises at least twice a week. This is in addition to the moderate-intensity exercise. Spend less time sitting. Even light physical activity can be beneficial. Watch cholesterol and blood lipids Have your blood tested for lipids and cholesterol at 74 years of age, then have this test every 5 years. Have your cholesterol levels checked more often if: Your lipid or cholesterol levels are high. You are older than 74 years of age. You are at high risk for heart disease. What should I know about cancer screening? Depending on your health history and family history, you may need to have cancer screening at various ages. This may include screening for: Breast cancer. Cervical cancer. Colorectal cancer. Skin cancer. Lung cancer. What should I know about heart disease, diabetes, and high blood pressure? Blood pressure and heart  disease High blood pressure causes heart disease and increases the risk of stroke. This is more likely to develop in people who have high blood pressure readings, are of African descent, or are overweight. Have your blood pressure checked: Every 3-5 years if you are 18-39 years of age. Every year if you are 40 years old or older. Diabetes Have regular diabetes screenings. This checks your fasting blood sugar level. Have the screening done: Once every three years after age 40 if you are at a normal weight and have a low risk for diabetes. More often and at a younger age if you are overweight or have a high risk for diabetes. What should I know about preventing infection? Hepatitis B If you have a higher risk for hepatitis B, you should be screened for this virus. Talk with your health care provider to find out if you are at risk for hepatitis B infection. Hepatitis C Testing is recommended for: Everyone born from 1945 through 1965. Anyone with known risk factors for hepatitis C. Sexually transmitted infections (STIs) Get screened for STIs, including gonorrhea and chlamydia, if: You are sexually active and are younger than 74 years of age. You are older than 74 years of age and your health care provider tells you that you are at risk for this type of infection. Your sexual activity has changed since you were last screened, and you are at increased risk for chlamydia or gonorrhea. Ask your health care provider if you are at risk. Ask your health care provider about whether you are at high risk for HIV. Your health care provider may recommend a prescription medicine   to help prevent HIV infection. If you choose to take medicine to prevent HIV, you should first get tested for HIV. You should then be tested every 3 months for as long as you are taking the medicine. Pregnancy If you are about to stop having your period (premenopausal) and you may become pregnant, seek counseling before you get  pregnant. Take 400 to 800 micrograms (mcg) of folic acid every day if you become pregnant. Ask for birth control (contraception) if you want to prevent pregnancy. Osteoporosis and menopause Osteoporosis is a disease in which the bones lose minerals and strength with aging. This can result in bone fractures. If you are 65 years old or older, or if you are at risk for osteoporosis and fractures, ask your health care provider if you should: Be screened for bone loss. Take a calcium or vitamin D supplement to lower your risk of fractures. Be given hormone replacement therapy (HRT) to treat symptoms of menopause. Follow these instructions at home: Lifestyle Do not use any products that contain nicotine or tobacco, such as cigarettes, e-cigarettes, and chewing tobacco. If you need help quitting, ask your health care provider. Do not use street drugs. Do not share needles. Ask your health care provider for help if you need support or information about quitting drugs. Alcohol use Do not drink alcohol if: Your health care provider tells you not to drink. You are pregnant, may be pregnant, or are planning to become pregnant. If you drink alcohol: Limit how much you use to 0-1 drink a day. Limit intake if you are breastfeeding. Be aware of how much alcohol is in your drink. In the [place] one drink equals one 12 oz bottle of beer (355 mL), one 5 oz glass of wine (148 mL), or one 1 oz glass of hard liquor (44 mL). General instructions Schedule regular health, dental, and eye exams. Stay current with your vaccines. Tell your health care provider if: You often feel depressed. You have ever been abused or do not feel safe at home. Summary Adopting a healthy lifestyle and getting preventive care are important in promoting health and wellness. Follow your health care provider's instructions about healthy diet, exercising, and getting tested or screened for diseases. Follow your health care provider's  instructions on monitoring your cholesterol and blood pressure. This information is not intended to replace advice given to you by your health care provider. Make sure you discuss any questions you have with your health care provider. Document Revised: 04/17/2020 Document Reviewed: 01/31/2018 Elsevier Patient Education  2022 [employer]  

## 2020-11-13 ENCOUNTER — Other Ambulatory Visit: Payer: Medicare HMO

## 2020-11-17 ENCOUNTER — Ambulatory Visit: Payer: Medicare HMO | Admitting: Family

## 2020-11-17 ENCOUNTER — Other Ambulatory Visit: Payer: Self-pay

## 2020-11-17 ENCOUNTER — Ambulatory Visit (INDEPENDENT_AMBULATORY_CARE_PROVIDER_SITE_OTHER): Payer: Medicare HMO

## 2020-11-17 ENCOUNTER — Other Ambulatory Visit: Payer: Medicare HMO

## 2020-11-17 DIAGNOSIS — I10 Essential (primary) hypertension: Secondary | ICD-10-CM | POA: Diagnosis not present

## 2020-11-17 DIAGNOSIS — E785 Hyperlipidemia, unspecified: Secondary | ICD-10-CM | POA: Diagnosis not present

## 2020-11-17 DIAGNOSIS — Z23 Encounter for immunization: Secondary | ICD-10-CM | POA: Diagnosis not present

## 2020-11-17 DIAGNOSIS — R634 Abnormal weight loss: Secondary | ICD-10-CM

## 2020-11-18 LAB — LIPID PANEL
Cholesterol: 213 mg/dL — ABNORMAL HIGH (ref <200)
HDL: 64 mg/dL (ref >=50)
LDL Cholesterol (Calc): 120 mg/dL (calc) — ABNORMAL HIGH
Non-HDL Cholesterol (Calc): 149 mg/dL (calc) — ABNORMAL HIGH (ref <130)
Total CHOL/HDL Ratio: 3.3 (calc) (ref <5.0)
Triglycerides: 177 mg/dL — ABNORMAL HIGH (ref <150)

## 2020-11-18 LAB — CBC WITH DIFFERENTIAL/PLATELET
Absolute Monocytes: 522 cells/uL (ref 200–950)
Basophils Absolute: 52 cells/uL (ref 0–200)
Basophils Relative: 1.1 %
Eosinophils Absolute: 80 cells/uL (ref 15–500)
Eosinophils Relative: 1.7 %
HCT: 37.5 % (ref 35.0–45.0)
Hemoglobin: 12.7 g/dL (ref 11.7–15.5)
Lymphs Abs: 1528 cells/uL (ref 850–3900)
MCH: 31.9 pg (ref 27.0–33.0)
MCHC: 33.9 g/dL (ref 32.0–36.0)
MCV: 94.2 fL (ref 80.0–100.0)
MPV: 9.9 fL (ref 7.5–12.5)
Monocytes Relative: 11.1 %
Neutro Abs: 2519 cells/uL (ref 1500–7800)
Neutrophils Relative %: 53.6 %
Platelets: 238 10*3/uL (ref 140–400)
RBC: 3.98 10*6/uL (ref 3.80–5.10)
RDW: 15 % (ref 11.0–15.0)
Total Lymphocyte: 32.5 %
WBC: 4.7 10*3/uL (ref 3.8–10.8)

## 2020-11-18 LAB — COMPLETE METABOLIC PANEL WITH GFR
AG Ratio: 1.4 (calc) (ref 1.0–2.5)
ALT: 16 U/L (ref 6–29)
AST: 22 U/L (ref 10–35)
Albumin: 4.4 g/dL (ref 3.6–5.1)
Alkaline phosphatase (APISO): 68 U/L (ref 37–153)
BUN: 16 mg/dL (ref 7–25)
CO2: 22 mmol/L (ref 20–32)
Calcium: 9.9 mg/dL (ref 8.6–10.4)
Chloride: 105 mmol/L (ref 98–110)
Creat: 0.98 mg/dL (ref 0.60–1.00)
Globulin: 3.1 g/dL (calc) (ref 1.9–3.7)
Glucose, Bld: 86 mg/dL (ref 65–99)
Potassium: 3.6 mmol/L (ref 3.5–5.3)
Sodium: 137 mmol/L (ref 135–146)
Total Bilirubin: 0.6 mg/dL (ref 0.2–1.2)
Total Protein: 7.5 g/dL (ref 6.1–8.1)
eGFR: 61 mL/min/{1.73_m2} (ref >=60)

## 2020-11-18 LAB — TSH: TSH: 1.67 mIU/L (ref 0.40–4.50)

## 2020-11-20 ENCOUNTER — Other Ambulatory Visit: Payer: Self-pay

## 2020-11-20 DIAGNOSIS — E785 Hyperlipidemia, unspecified: Secondary | ICD-10-CM

## 2020-11-20 MED ORDER — ATORVASTATIN CALCIUM 40 MG PO TABS: 40.0000 mg | ORAL_TABLET | Freq: Every day | ORAL | 1 refills | Status: AC

## 2020-11-24 ENCOUNTER — Encounter: Payer: Medicare HMO | Admitting: Family

## 2020-11-24 ENCOUNTER — Ambulatory Visit: Payer: Medicare HMO | Admitting: Family

## 2020-11-24 DIAGNOSIS — I469 Cardiac arrest, cause unspecified: Secondary | ICD-10-CM | POA: Diagnosis not present

## 2020-11-24 DIAGNOSIS — R4020 Unspecified coma: Secondary | ICD-10-CM | POA: Diagnosis not present

## 2020-11-29 NOTE — Progress Notes (Signed)
This encounter was created in error - please disregard.

## 2020-12-22 DIAGNOSIS — 419620001 Death: Secondary | SNOMED CT | POA: Diagnosis not present

## 2020-12-22 DEATH — deceased

## 2021-05-21 ENCOUNTER — Other Ambulatory Visit: Payer: Medicare HMO
